# Patient Record
Sex: Female | Born: 1940 | Race: White | Hispanic: No | Smoking: Never smoker
Health system: Southern US, Community
[De-identification: ages and names within clinical notes are randomized; demographics above are authoritative.]

---

## 2008-08-27 ENCOUNTER — Ambulatory Visit: Payer: Self-pay | Admitting: Internal Medicine

## 2008-09-03 ENCOUNTER — Ambulatory Visit: Payer: Self-pay | Admitting: Internal Medicine

## 2009-02-07 HISTORY — PX: BREAST CYST ASPIRATION: SHX578

## 2009-04-17 ENCOUNTER — Ambulatory Visit: Payer: Self-pay | Admitting: Unknown Physician Specialty

## 2011-03-11 ENCOUNTER — Ambulatory Visit: Payer: Self-pay | Admitting: Internal Medicine

## 2012-04-11 ENCOUNTER — Ambulatory Visit: Payer: Self-pay | Admitting: Internal Medicine

## 2012-04-24 ENCOUNTER — Ambulatory Visit: Payer: Self-pay | Admitting: Internal Medicine

## 2012-05-15 ENCOUNTER — Ambulatory Visit: Payer: Self-pay | Admitting: Surgery

## 2012-05-15 ENCOUNTER — Ambulatory Visit: Payer: Self-pay | Admitting: General Surgery

## 2012-08-22 ENCOUNTER — Ambulatory Visit: Payer: Self-pay | Admitting: Ophthalmology

## 2012-09-26 ENCOUNTER — Ambulatory Visit: Payer: Self-pay | Admitting: Ophthalmology

## 2012-10-22 ENCOUNTER — Ambulatory Visit: Payer: Self-pay | Admitting: Podiatry

## 2013-04-12 ENCOUNTER — Ambulatory Visit: Payer: Self-pay | Admitting: Internal Medicine

## 2014-03-07 DIAGNOSIS — I10 Essential (primary) hypertension: Secondary | ICD-10-CM | POA: Diagnosis not present

## 2014-03-07 DIAGNOSIS — E785 Hyperlipidemia, unspecified: Secondary | ICD-10-CM | POA: Diagnosis not present

## 2014-03-14 DIAGNOSIS — E780 Pure hypercholesterolemia: Secondary | ICD-10-CM | POA: Diagnosis not present

## 2014-03-14 DIAGNOSIS — F419 Anxiety disorder, unspecified: Secondary | ICD-10-CM | POA: Insufficient documentation

## 2014-03-14 DIAGNOSIS — Z1239 Encounter for other screening for malignant neoplasm of breast: Secondary | ICD-10-CM | POA: Diagnosis not present

## 2014-03-14 DIAGNOSIS — Z1211 Encounter for screening for malignant neoplasm of colon: Secondary | ICD-10-CM | POA: Diagnosis not present

## 2014-03-14 DIAGNOSIS — I10 Essential (primary) hypertension: Secondary | ICD-10-CM | POA: Diagnosis not present

## 2014-05-09 ENCOUNTER — Ambulatory Visit
Admit: 2014-05-09 | Disposition: A | Discharge status: Home / Self Care | Payer: Self-pay | Attending: Unknown Physician Specialty | Admitting: Unknown Physician Specialty

## 2014-05-09 DIAGNOSIS — I10 Essential (primary) hypertension: Secondary | ICD-10-CM | POA: Diagnosis not present

## 2014-05-09 DIAGNOSIS — Z1211 Encounter for screening for malignant neoplasm of colon: Secondary | ICD-10-CM | POA: Diagnosis not present

## 2014-05-09 DIAGNOSIS — Z800 Family history of malignant neoplasm of digestive organs: Secondary | ICD-10-CM | POA: Diagnosis not present

## 2014-05-09 DIAGNOSIS — K640 First degree hemorrhoids: Secondary | ICD-10-CM | POA: Diagnosis not present

## 2014-05-09 DIAGNOSIS — Z791 Long term (current) use of non-steroidal anti-inflammatories (NSAID): Secondary | ICD-10-CM | POA: Diagnosis not present

## 2014-05-09 DIAGNOSIS — E780 Pure hypercholesterolemia: Secondary | ICD-10-CM | POA: Diagnosis not present

## 2014-05-09 DIAGNOSIS — D128 Benign neoplasm of rectum: Secondary | ICD-10-CM | POA: Diagnosis not present

## 2014-05-09 DIAGNOSIS — K621 Rectal polyp: Secondary | ICD-10-CM | POA: Diagnosis not present

## 2014-05-09 DIAGNOSIS — E785 Hyperlipidemia, unspecified: Secondary | ICD-10-CM | POA: Diagnosis not present

## 2014-05-09 DIAGNOSIS — Z8371 Family history of colonic polyps: Secondary | ICD-10-CM | POA: Diagnosis not present

## 2014-05-09 DIAGNOSIS — F419 Anxiety disorder, unspecified: Secondary | ICD-10-CM | POA: Diagnosis not present

## 2014-06-02 LAB — SURGICAL PATHOLOGY

## 2014-06-04 ENCOUNTER — Ambulatory Visit
Admit: 2014-06-04 | Disposition: A | Discharge status: Home / Self Care | Payer: Self-pay | Attending: Internal Medicine | Admitting: Internal Medicine

## 2014-06-04 DIAGNOSIS — Z1231 Encounter for screening mammogram for malignant neoplasm of breast: Secondary | ICD-10-CM | POA: Diagnosis not present

## 2014-09-11 DIAGNOSIS — I10 Essential (primary) hypertension: Secondary | ICD-10-CM | POA: Diagnosis not present

## 2014-09-11 DIAGNOSIS — Z791 Long term (current) use of non-steroidal anti-inflammatories (NSAID): Secondary | ICD-10-CM | POA: Diagnosis not present

## 2014-09-11 DIAGNOSIS — E780 Pure hypercholesterolemia: Secondary | ICD-10-CM | POA: Diagnosis not present

## 2014-09-18 DIAGNOSIS — E780 Pure hypercholesterolemia: Secondary | ICD-10-CM | POA: Diagnosis not present

## 2014-09-18 DIAGNOSIS — F419 Anxiety disorder, unspecified: Secondary | ICD-10-CM | POA: Diagnosis not present

## 2014-09-18 DIAGNOSIS — I10 Essential (primary) hypertension: Secondary | ICD-10-CM | POA: Diagnosis not present

## 2014-12-10 DIAGNOSIS — M65271 Calcific tendinitis, right ankle and foot: Secondary | ICD-10-CM | POA: Diagnosis not present

## 2014-12-10 DIAGNOSIS — M65272 Calcific tendinitis, left ankle and foot: Secondary | ICD-10-CM | POA: Diagnosis not present

## 2015-03-23 DIAGNOSIS — F419 Anxiety disorder, unspecified: Secondary | ICD-10-CM | POA: Diagnosis not present

## 2015-03-23 DIAGNOSIS — R030 Elevated blood-pressure reading, without diagnosis of hypertension: Secondary | ICD-10-CM | POA: Diagnosis not present

## 2015-03-23 DIAGNOSIS — E7800 Pure hypercholesterolemia, unspecified: Secondary | ICD-10-CM | POA: Diagnosis not present

## 2015-03-30 ENCOUNTER — Other Ambulatory Visit: Payer: Self-pay | Admitting: Internal Medicine

## 2015-03-30 DIAGNOSIS — Z1239 Encounter for other screening for malignant neoplasm of breast: Secondary | ICD-10-CM | POA: Diagnosis not present

## 2015-03-30 DIAGNOSIS — Z1231 Encounter for screening mammogram for malignant neoplasm of breast: Secondary | ICD-10-CM

## 2015-03-30 DIAGNOSIS — F419 Anxiety disorder, unspecified: Secondary | ICD-10-CM | POA: Diagnosis not present

## 2015-03-30 DIAGNOSIS — E784 Other hyperlipidemia: Secondary | ICD-10-CM | POA: Diagnosis not present

## 2015-03-30 DIAGNOSIS — R030 Elevated blood-pressure reading, without diagnosis of hypertension: Secondary | ICD-10-CM | POA: Diagnosis not present

## 2015-06-05 ENCOUNTER — Ambulatory Visit: Payer: Self-pay

## 2015-06-08 ENCOUNTER — Ambulatory Visit
Admission: RE | Admit: 2015-06-08 | Discharge: 2015-06-08 | Disposition: A | Discharge status: Home / Self Care | Payer: Commercial Managed Care - HMO | Source: Ambulatory Visit | Attending: Internal Medicine | Admitting: Internal Medicine

## 2015-06-08 ENCOUNTER — Other Ambulatory Visit: Payer: Self-pay | Admitting: Internal Medicine

## 2015-06-08 DIAGNOSIS — Z1231 Encounter for screening mammogram for malignant neoplasm of breast: Secondary | ICD-10-CM

## 2015-07-13 DIAGNOSIS — H04123 Dry eye syndrome of bilateral lacrimal glands: Secondary | ICD-10-CM | POA: Diagnosis not present

## 2015-09-22 DIAGNOSIS — F419 Anxiety disorder, unspecified: Secondary | ICD-10-CM | POA: Diagnosis not present

## 2015-09-22 DIAGNOSIS — E784 Other hyperlipidemia: Secondary | ICD-10-CM | POA: Diagnosis not present

## 2015-09-22 DIAGNOSIS — R030 Elevated blood-pressure reading, without diagnosis of hypertension: Secondary | ICD-10-CM | POA: Diagnosis not present

## 2015-09-30 ENCOUNTER — Ambulatory Visit (INDEPENDENT_AMBULATORY_CARE_PROVIDER_SITE_OTHER): Payer: Commercial Managed Care - HMO

## 2015-09-30 ENCOUNTER — Ambulatory Visit (INDEPENDENT_AMBULATORY_CARE_PROVIDER_SITE_OTHER): Payer: Commercial Managed Care - HMO | Admitting: Podiatry

## 2015-09-30 ENCOUNTER — Encounter: Payer: Self-pay | Admitting: Podiatry

## 2015-09-30 VITALS — BP 150/88 | HR 69 | Resp 12

## 2015-09-30 DIAGNOSIS — R030 Elevated blood-pressure reading, without diagnosis of hypertension: Secondary | ICD-10-CM | POA: Diagnosis not present

## 2015-09-30 DIAGNOSIS — M722 Plantar fascial fibromatosis: Secondary | ICD-10-CM

## 2015-09-30 DIAGNOSIS — E784 Other hyperlipidemia: Secondary | ICD-10-CM | POA: Diagnosis not present

## 2015-09-30 DIAGNOSIS — F419 Anxiety disorder, unspecified: Secondary | ICD-10-CM | POA: Diagnosis not present

## 2015-09-30 MED ORDER — METHYLPREDNISOLONE 4 MG PO TBPK: ORAL_TABLET | ORAL | 0 refills | Status: DC

## 2015-09-30 MED ORDER — MELOXICAM 15 MG PO TABS: 15.0000 mg | ORAL_TABLET | Freq: Every day | ORAL | 3 refills | Status: DC

## 2015-09-30 NOTE — Patient Instructions (Signed)

## 2015-09-30 NOTE — Progress Notes (Signed)
   Subjective:    Patient ID: Barbara Jensen, female    DOB: 05/27/1940, 75 y.o.   MRN: [id number]  HPI: She presents today with a 2 year history of inferior posterior heel pain. She's been to multiple doctors including podiatrists and orthopedic surgeons have done nothing more than stretching exercises and inserts. She states that her feet hurt worse in the morning and after she's been sitting for a while and gets back up to walk. She denies any trauma or relates shoe gear irritation to the posterior aspect of the right heel.    Review of Systems  Musculoskeletal: Positive for gait problem.       Objective:   Physical Exam: Vital signs are stable she is alert and oriented 3 in no apparent distress. Pulses are strongly palpable. Neurologic sensorium is intact. Deep tendon reflexes are intact. Muscle strength +5 over 5 or 6 flexors plantar flexors and inverters everters all intrinsic musculature is intact. Orthopedic evaluation demonstrates all joints distal to the ankle for range of motion without crepitation. She has mild tenderness on palpation posterior aspect of the bilateral heels right greater than left with a large nonpulsatile nodular mass in the dorsal lateral aspect of the posterior calcaneus right. She also has pain on palpation medial calcaneal tubercles bilateral. Radiographs confirm spurs posterior and inferior with soft tissue increase in densities in both sides. This is consistent with plantar fasciitis and Achilles tendinitis. Cutaneous evaluation demonstrates supple well-hydrated cutis no signs of infection no open lesions or wounds.        Assessment & Plan:  Plantar fasciitis bilateral with posterior compensatory Achilles tendinitis.  Plan: Injected the bilateral heels today with Kenalog and local anesthetic plantarly. Placed her on a Medrol Dosepak to be followed by meloxicam. Discussed appropriate shoe gear stretching exercises and ice therapy. Dispense plantar fascia  braces and night splints. Follow up with her in 1 month. Also dispensed stretching exercises.

## 2015-10-28 ENCOUNTER — Ambulatory Visit (INDEPENDENT_AMBULATORY_CARE_PROVIDER_SITE_OTHER): Payer: Commercial Managed Care - HMO | Admitting: Podiatry

## 2015-10-28 ENCOUNTER — Encounter: Payer: Self-pay | Admitting: Podiatry

## 2015-10-28 DIAGNOSIS — M722 Plantar fascial fibromatosis: Secondary | ICD-10-CM | POA: Diagnosis not present

## 2015-10-28 NOTE — Progress Notes (Signed)
She presents today for follow-up of her plantar fasciitis. He states for about 2 weeks she was 100% improved. Now she has regressed back to approximately 50-70%.  Objective: Vital signs are stable alert and oriented 3. She has pain on palpation medial calcaneal tubercle left greater than right.  Assessment: Pain in limb secondary to plantar fasciitis bilateral.  Plan: We injected bilateral heels today and she will continue her meloxicam. She will continue all other conservative therapies by a brace and night splint. I will follow up with her in 1 month. She is going on a cruise at the end of December.

## 2015-12-07 ENCOUNTER — Ambulatory Visit (INDEPENDENT_AMBULATORY_CARE_PROVIDER_SITE_OTHER): Payer: Commercial Managed Care - HMO | Admitting: Podiatry

## 2015-12-07 DIAGNOSIS — M7662 Achilles tendinitis, left leg: Secondary | ICD-10-CM | POA: Diagnosis not present

## 2015-12-07 DIAGNOSIS — M722 Plantar fascial fibromatosis: Secondary | ICD-10-CM

## 2015-12-07 DIAGNOSIS — M7661 Achilles tendinitis, right leg: Secondary | ICD-10-CM

## 2015-12-07 NOTE — Progress Notes (Signed)
She presents today for follow-up of plantar fasciitis to the left foot and some Achilles tendinitis to the right foot. She states that at this point with the injection and the anti-inflammatory she cannot honestly say she is approximately 90-95% improved. She states that she feels like she continues to improve all time. She will be leaving on a cruise December 31.  Objective: Vital signs are stable alert and oriented 3. Pulses are palpable. Neurologic sensorium is intact. Degenerative flexors are intact. Mild tenderness on palpation may continue tube of the left heel but there is no warmth on palpation. She has no pain on palpation of the Achilles right.  Assessment: Resolving plantar fasciitis left and Achilles tendinitis right.  Plan: Encouraged her to continue all conservative therapies including medications and braces night splints and shoe gear change. Follow up with her in 6 weeks if necessary.

## 2016-01-20 ENCOUNTER — Ambulatory Visit (INDEPENDENT_AMBULATORY_CARE_PROVIDER_SITE_OTHER): Payer: Commercial Managed Care - HMO | Admitting: Podiatry

## 2016-01-20 ENCOUNTER — Encounter: Payer: Self-pay | Admitting: Podiatry

## 2016-01-20 DIAGNOSIS — M7661 Achilles tendinitis, right leg: Secondary | ICD-10-CM

## 2016-01-20 DIAGNOSIS — M7662 Achilles tendinitis, left leg: Secondary | ICD-10-CM | POA: Diagnosis not present

## 2016-01-20 DIAGNOSIS — M722 Plantar fascial fibromatosis: Secondary | ICD-10-CM | POA: Diagnosis not present

## 2016-01-20 MED ORDER — DICLOFENAC SODIUM 75 MG PO TBEC: 75.0000 mg | DELAYED_RELEASE_TABLET | Freq: Two times a day (BID) | ORAL | 1 refills | Status: AC

## 2016-01-20 NOTE — Progress Notes (Signed)
She presents today for follow-up of plantar fasciitis of the left heel and Achilles tendinitis to the right heel. She states that she was doing great and now has regressed.  Objective: Pulses are palpable. She still has pain on palpation posterior inferolateral aspect of the Achilles tendon right posterior heel pain on palpation medial calcaneal tubercle of the left plantar fascia calcaneal insertion site.  Assessment: Intractable Achilles tendinitis right and intractable plantar fasciitis left.  Plan: Discussed etiology pathologies are versus surgical therapies at this point I recommended she see scanned for orthotics and I reinjected the bilateral sites today. I will follow up with her when she uses orthotics and consider physical therapy at that time.

## 2016-02-17 ENCOUNTER — Ambulatory Visit: Payer: Commercial Managed Care - HMO | Admitting: Podiatry

## 2016-02-22 ENCOUNTER — Ambulatory Visit (INDEPENDENT_AMBULATORY_CARE_PROVIDER_SITE_OTHER): Payer: Self-pay | Admitting: Podiatry

## 2016-02-22 ENCOUNTER — Encounter: Payer: Self-pay | Admitting: Podiatry

## 2016-02-22 DIAGNOSIS — M7661 Achilles tendinitis, right leg: Secondary | ICD-10-CM

## 2016-02-22 DIAGNOSIS — M7662 Achilles tendinitis, left leg: Secondary | ICD-10-CM

## 2016-02-22 DIAGNOSIS — M722 Plantar fascial fibromatosis: Secondary | ICD-10-CM

## 2016-02-22 NOTE — Progress Notes (Signed)
Dispensed patient's orthotics with oral and written instructions for wearing. Patient will follow up with Dr. Hyatt in 1 month for an orthotic check. 

## 2016-02-22 NOTE — Patient Instructions (Signed)

## 2016-03-25 DIAGNOSIS — F419 Anxiety disorder, unspecified: Secondary | ICD-10-CM | POA: Diagnosis not present

## 2016-03-25 DIAGNOSIS — E7800 Pure hypercholesterolemia, unspecified: Secondary | ICD-10-CM | POA: Diagnosis not present

## 2016-03-25 DIAGNOSIS — Z0000 Encounter for general adult medical examination without abnormal findings: Secondary | ICD-10-CM | POA: Diagnosis not present

## 2016-03-25 DIAGNOSIS — R030 Elevated blood-pressure reading, without diagnosis of hypertension: Secondary | ICD-10-CM | POA: Diagnosis not present

## 2016-04-01 ENCOUNTER — Other Ambulatory Visit: Payer: Self-pay | Admitting: Internal Medicine

## 2016-04-01 DIAGNOSIS — F320 Major depressive disorder, single episode, mild: Secondary | ICD-10-CM | POA: Diagnosis not present

## 2016-04-01 DIAGNOSIS — F3342 Major depressive disorder, recurrent, in full remission: Secondary | ICD-10-CM | POA: Insufficient documentation

## 2016-04-01 DIAGNOSIS — E784 Other hyperlipidemia: Secondary | ICD-10-CM | POA: Diagnosis not present

## 2016-04-01 DIAGNOSIS — Z0000 Encounter for general adult medical examination without abnormal findings: Secondary | ICD-10-CM | POA: Diagnosis not present

## 2016-04-01 DIAGNOSIS — R030 Elevated blood-pressure reading, without diagnosis of hypertension: Secondary | ICD-10-CM | POA: Diagnosis not present

## 2016-04-01 DIAGNOSIS — Z1231 Encounter for screening mammogram for malignant neoplasm of breast: Secondary | ICD-10-CM | POA: Diagnosis not present

## 2016-04-01 DIAGNOSIS — F419 Anxiety disorder, unspecified: Secondary | ICD-10-CM | POA: Diagnosis not present

## 2016-04-07 DIAGNOSIS — L718 Other rosacea: Secondary | ICD-10-CM | POA: Diagnosis not present

## 2016-04-07 DIAGNOSIS — L578 Other skin changes due to chronic exposure to nonionizing radiation: Secondary | ICD-10-CM | POA: Diagnosis not present

## 2016-04-07 DIAGNOSIS — D1800 Hemangioma unspecified site: Secondary | ICD-10-CM | POA: Diagnosis not present

## 2016-04-07 DIAGNOSIS — L821 Other seborrheic keratosis: Secondary | ICD-10-CM | POA: Diagnosis not present

## 2016-04-07 DIAGNOSIS — D692 Other nonthrombocytopenic purpura: Secondary | ICD-10-CM | POA: Diagnosis not present

## 2016-04-07 DIAGNOSIS — Z1283 Encounter for screening for malignant neoplasm of skin: Secondary | ICD-10-CM | POA: Diagnosis not present

## 2016-04-07 DIAGNOSIS — L570 Actinic keratosis: Secondary | ICD-10-CM | POA: Diagnosis not present

## 2016-04-07 DIAGNOSIS — L820 Inflamed seborrheic keratosis: Secondary | ICD-10-CM | POA: Diagnosis not present

## 2016-04-07 DIAGNOSIS — L720 Epidermal cyst: Secondary | ICD-10-CM | POA: Diagnosis not present

## 2016-05-02 DIAGNOSIS — F320 Major depressive disorder, single episode, mild: Secondary | ICD-10-CM | POA: Diagnosis not present

## 2016-05-02 DIAGNOSIS — R030 Elevated blood-pressure reading, without diagnosis of hypertension: Secondary | ICD-10-CM | POA: Diagnosis not present

## 2016-05-02 DIAGNOSIS — E784 Other hyperlipidemia: Secondary | ICD-10-CM | POA: Diagnosis not present

## 2016-06-09 ENCOUNTER — Ambulatory Visit: Payer: Commercial Managed Care - HMO | Attending: Internal Medicine

## 2016-07-26 DIAGNOSIS — E784 Other hyperlipidemia: Secondary | ICD-10-CM | POA: Diagnosis not present

## 2016-07-26 DIAGNOSIS — R030 Elevated blood-pressure reading, without diagnosis of hypertension: Secondary | ICD-10-CM | POA: Diagnosis not present

## 2016-08-02 DIAGNOSIS — E784 Other hyperlipidemia: Secondary | ICD-10-CM | POA: Diagnosis not present

## 2016-08-02 DIAGNOSIS — R030 Elevated blood-pressure reading, without diagnosis of hypertension: Secondary | ICD-10-CM | POA: Diagnosis not present

## 2016-08-02 DIAGNOSIS — F320 Major depressive disorder, single episode, mild: Secondary | ICD-10-CM | POA: Diagnosis not present

## 2016-08-02 DIAGNOSIS — F419 Anxiety disorder, unspecified: Secondary | ICD-10-CM | POA: Diagnosis not present

## 2016-08-03 DIAGNOSIS — R69 Illness, unspecified: Secondary | ICD-10-CM | POA: Diagnosis not present

## 2016-12-22 DIAGNOSIS — K006 Disturbances in tooth eruption: Secondary | ICD-10-CM | POA: Diagnosis not present

## 2016-12-22 DIAGNOSIS — R69 Illness, unspecified: Secondary | ICD-10-CM | POA: Diagnosis not present

## 2017-02-02 DIAGNOSIS — F320 Major depressive disorder, single episode, mild: Secondary | ICD-10-CM | POA: Diagnosis not present

## 2017-02-02 DIAGNOSIS — R030 Elevated blood-pressure reading, without diagnosis of hypertension: Secondary | ICD-10-CM | POA: Diagnosis not present

## 2017-02-02 DIAGNOSIS — E785 Hyperlipidemia, unspecified: Secondary | ICD-10-CM | POA: Diagnosis not present

## 2017-02-13 DIAGNOSIS — Z1231 Encounter for screening mammogram for malignant neoplasm of breast: Secondary | ICD-10-CM | POA: Diagnosis not present

## 2017-02-13 DIAGNOSIS — F320 Major depressive disorder, single episode, mild: Secondary | ICD-10-CM | POA: Diagnosis not present

## 2017-02-13 DIAGNOSIS — F419 Anxiety disorder, unspecified: Secondary | ICD-10-CM | POA: Diagnosis not present

## 2017-02-13 DIAGNOSIS — E7849 Other hyperlipidemia: Secondary | ICD-10-CM | POA: Diagnosis not present

## 2017-02-13 DIAGNOSIS — R030 Elevated blood-pressure reading, without diagnosis of hypertension: Secondary | ICD-10-CM | POA: Diagnosis not present

## 2017-03-09 ENCOUNTER — Ambulatory Visit
Admission: RE | Admit: 2017-03-09 | Discharge: 2017-03-09 | Disposition: A | Discharge status: Home / Self Care | Payer: Medicare HMO | Source: Ambulatory Visit | Attending: Internal Medicine | Admitting: Internal Medicine

## 2017-03-09 DIAGNOSIS — Z1231 Encounter for screening mammogram for malignant neoplasm of breast: Secondary | ICD-10-CM | POA: Diagnosis not present

## 2017-07-13 DIAGNOSIS — E7849 Other hyperlipidemia: Secondary | ICD-10-CM | POA: Diagnosis not present

## 2017-07-13 DIAGNOSIS — F320 Major depressive disorder, single episode, mild: Secondary | ICD-10-CM | POA: Diagnosis not present

## 2017-07-13 DIAGNOSIS — F419 Anxiety disorder, unspecified: Secondary | ICD-10-CM | POA: Diagnosis not present

## 2017-07-13 DIAGNOSIS — R030 Elevated blood-pressure reading, without diagnosis of hypertension: Secondary | ICD-10-CM | POA: Diagnosis not present

## 2017-07-19 DIAGNOSIS — M549 Dorsalgia, unspecified: Secondary | ICD-10-CM | POA: Diagnosis not present

## 2017-07-19 DIAGNOSIS — Z23 Encounter for immunization: Secondary | ICD-10-CM | POA: Diagnosis not present

## 2017-07-19 DIAGNOSIS — E7849 Other hyperlipidemia: Secondary | ICD-10-CM | POA: Diagnosis not present

## 2017-07-19 DIAGNOSIS — R030 Elevated blood-pressure reading, without diagnosis of hypertension: Secondary | ICD-10-CM | POA: Diagnosis not present

## 2017-07-19 DIAGNOSIS — Z0000 Encounter for general adult medical examination without abnormal findings: Secondary | ICD-10-CM | POA: Diagnosis not present

## 2017-07-19 DIAGNOSIS — F320 Major depressive disorder, single episode, mild: Secondary | ICD-10-CM | POA: Diagnosis not present

## 2017-07-19 DIAGNOSIS — Z79899 Other long term (current) drug therapy: Secondary | ICD-10-CM | POA: Diagnosis not present

## 2017-07-19 DIAGNOSIS — S81812A Laceration without foreign body, left lower leg, initial encounter: Secondary | ICD-10-CM | POA: Diagnosis not present

## 2017-07-19 DIAGNOSIS — F419 Anxiety disorder, unspecified: Secondary | ICD-10-CM | POA: Diagnosis not present

## 2017-11-29 DIAGNOSIS — D692 Other nonthrombocytopenic purpura: Secondary | ICD-10-CM | POA: Diagnosis not present

## 2017-11-29 DIAGNOSIS — L820 Inflamed seborrheic keratosis: Secondary | ICD-10-CM | POA: Diagnosis not present

## 2017-11-29 DIAGNOSIS — L821 Other seborrheic keratosis: Secondary | ICD-10-CM | POA: Diagnosis not present

## 2017-11-29 DIAGNOSIS — L578 Other skin changes due to chronic exposure to nonionizing radiation: Secondary | ICD-10-CM | POA: Diagnosis not present

## 2017-11-29 DIAGNOSIS — L570 Actinic keratosis: Secondary | ICD-10-CM | POA: Diagnosis not present

## 2017-11-29 DIAGNOSIS — D229 Melanocytic nevi, unspecified: Secondary | ICD-10-CM | POA: Diagnosis not present

## 2017-11-29 DIAGNOSIS — Z1283 Encounter for screening for malignant neoplasm of skin: Secondary | ICD-10-CM | POA: Diagnosis not present

## 2017-11-29 DIAGNOSIS — L812 Freckles: Secondary | ICD-10-CM | POA: Diagnosis not present

## 2018-01-17 DIAGNOSIS — R030 Elevated blood-pressure reading, without diagnosis of hypertension: Secondary | ICD-10-CM | POA: Diagnosis not present

## 2018-01-17 DIAGNOSIS — E7849 Other hyperlipidemia: Secondary | ICD-10-CM | POA: Diagnosis not present

## 2018-01-17 DIAGNOSIS — Z79899 Other long term (current) drug therapy: Secondary | ICD-10-CM | POA: Diagnosis not present

## 2018-01-24 DIAGNOSIS — E7849 Other hyperlipidemia: Secondary | ICD-10-CM | POA: Diagnosis not present

## 2018-01-24 DIAGNOSIS — F320 Major depressive disorder, single episode, mild: Secondary | ICD-10-CM | POA: Diagnosis not present

## 2018-01-24 DIAGNOSIS — R030 Elevated blood-pressure reading, without diagnosis of hypertension: Secondary | ICD-10-CM | POA: Diagnosis not present

## 2018-02-15 ENCOUNTER — Other Ambulatory Visit: Payer: Self-pay | Admitting: Internal Medicine

## 2018-02-15 DIAGNOSIS — Z1231 Encounter for screening mammogram for malignant neoplasm of breast: Secondary | ICD-10-CM

## 2018-07-19 DIAGNOSIS — R030 Elevated blood-pressure reading, without diagnosis of hypertension: Secondary | ICD-10-CM | POA: Diagnosis not present

## 2018-07-19 DIAGNOSIS — E7849 Other hyperlipidemia: Secondary | ICD-10-CM | POA: Diagnosis not present

## 2018-07-19 DIAGNOSIS — F320 Major depressive disorder, single episode, mild: Secondary | ICD-10-CM | POA: Diagnosis not present

## 2018-07-26 DIAGNOSIS — N183 Chronic kidney disease, stage 3 (moderate): Secondary | ICD-10-CM | POA: Diagnosis not present

## 2018-07-26 DIAGNOSIS — E7849 Other hyperlipidemia: Secondary | ICD-10-CM | POA: Diagnosis not present

## 2018-07-26 DIAGNOSIS — Z780 Asymptomatic menopausal state: Secondary | ICD-10-CM | POA: Diagnosis not present

## 2018-07-26 DIAGNOSIS — F419 Anxiety disorder, unspecified: Secondary | ICD-10-CM | POA: Diagnosis not present

## 2018-07-26 DIAGNOSIS — R030 Elevated blood-pressure reading, without diagnosis of hypertension: Secondary | ICD-10-CM | POA: Diagnosis not present

## 2018-07-26 DIAGNOSIS — Z0000 Encounter for general adult medical examination without abnormal findings: Secondary | ICD-10-CM | POA: Diagnosis not present

## 2018-07-26 DIAGNOSIS — F320 Major depressive disorder, single episode, mild: Secondary | ICD-10-CM | POA: Diagnosis not present

## 2018-08-31 ENCOUNTER — Ambulatory Visit
Admission: RE | Admit: 2018-08-31 | Discharge: 2018-08-31 | Disposition: A | Discharge status: Home / Self Care | Payer: Medicare HMO | Source: Ambulatory Visit | Attending: Internal Medicine | Admitting: Internal Medicine

## 2018-08-31 DIAGNOSIS — Z1231 Encounter for screening mammogram for malignant neoplasm of breast: Secondary | ICD-10-CM | POA: Insufficient documentation

## 2018-09-05 DIAGNOSIS — Z780 Asymptomatic menopausal state: Secondary | ICD-10-CM | POA: Diagnosis not present

## 2018-10-30 DIAGNOSIS — H43813 Vitreous degeneration, bilateral: Secondary | ICD-10-CM | POA: Diagnosis not present

## 2018-12-10 DIAGNOSIS — R002 Palpitations: Secondary | ICD-10-CM | POA: Diagnosis not present

## 2018-12-10 DIAGNOSIS — M25512 Pain in left shoulder: Secondary | ICD-10-CM | POA: Diagnosis not present

## 2018-12-10 DIAGNOSIS — R030 Elevated blood-pressure reading, without diagnosis of hypertension: Secondary | ICD-10-CM | POA: Diagnosis not present

## 2018-12-10 DIAGNOSIS — F419 Anxiety disorder, unspecified: Secondary | ICD-10-CM | POA: Diagnosis not present

## 2018-12-28 IMAGING — MG MM DIGITAL SCREENING BILAT W/ TOMO W/ CAD
9 of 12 series · 9 of 28 positions shown · non-contrast
Comparison: Previous exam(s).

CLINICAL DATA: Screening.

EXAM:
2D DIGITAL SCREENING BILATERAL MAMMOGRAM WITH 3D TOMO WITH CAD

[L MLO synth-2D]
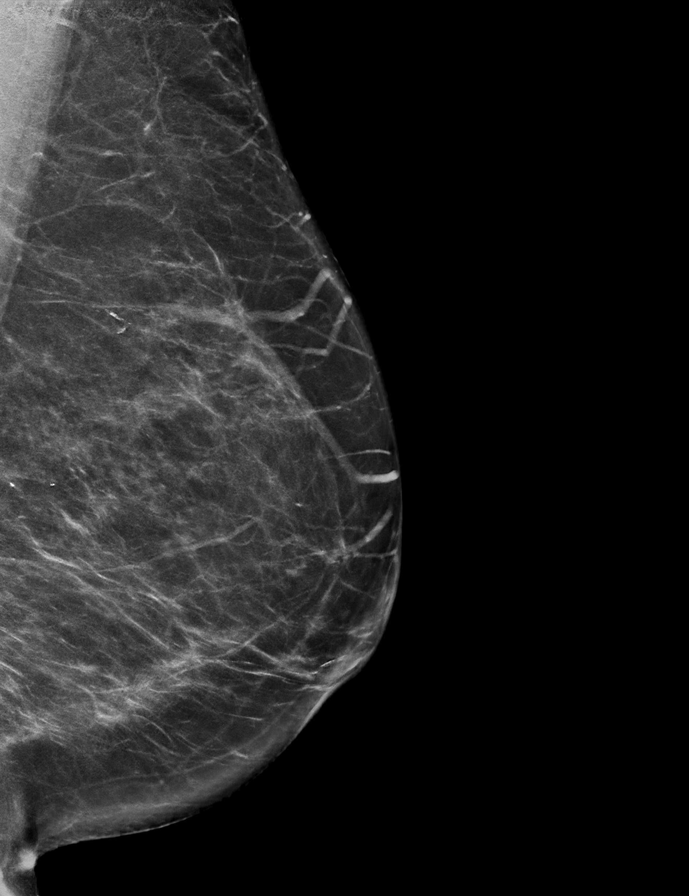

[R CC]
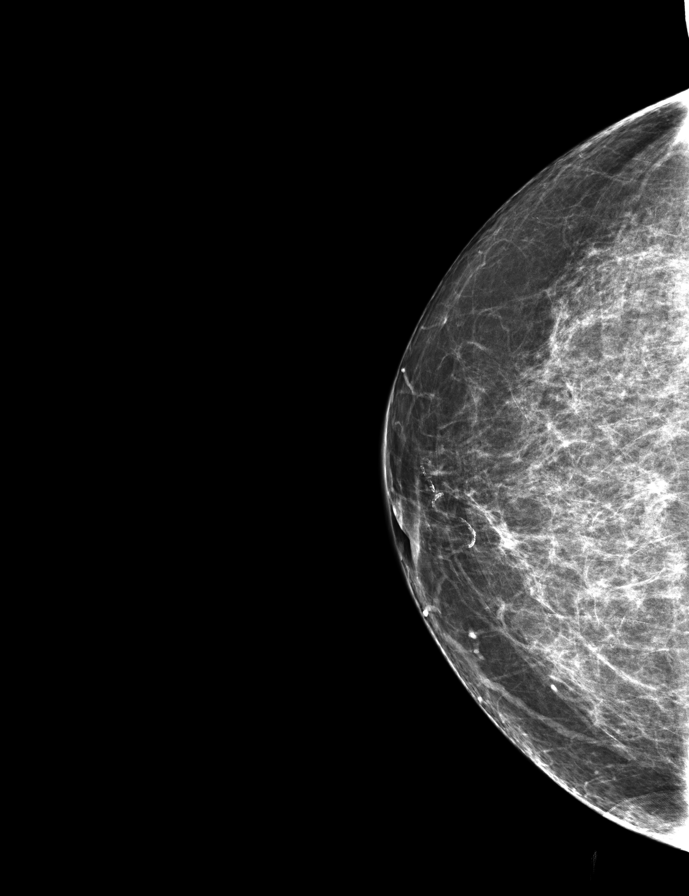

[L MLO]
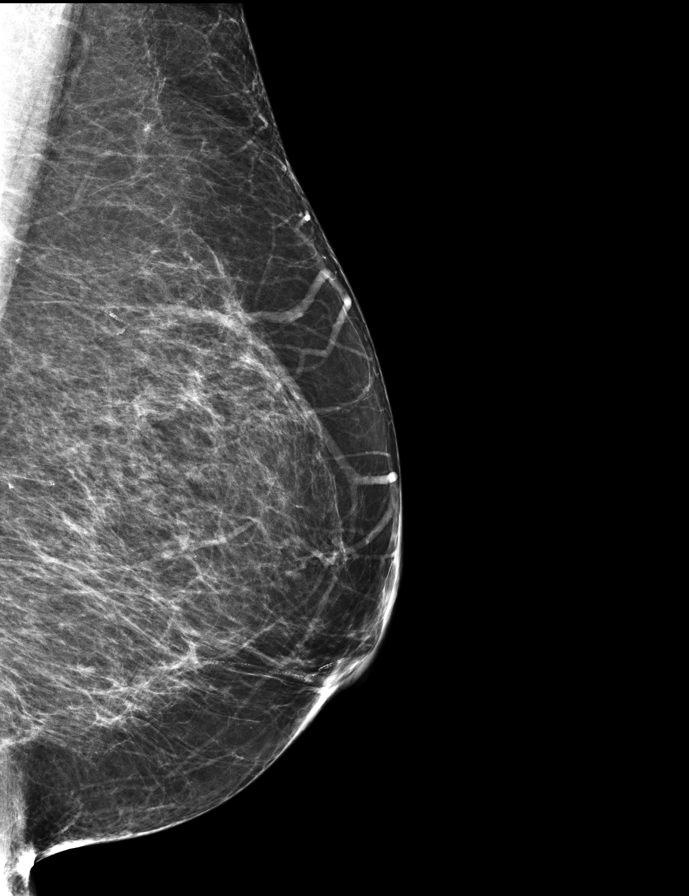

[R MLO]
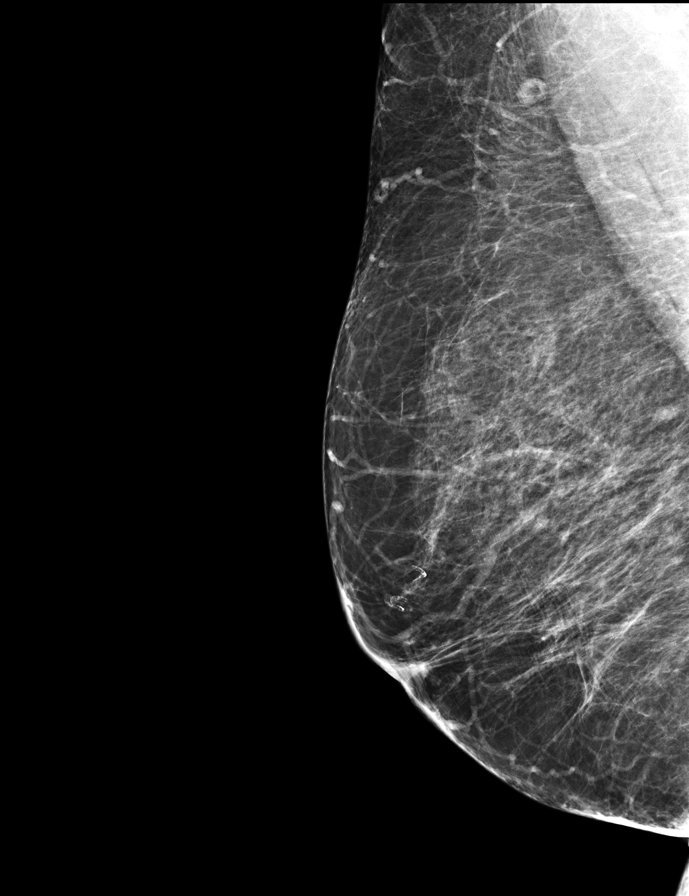

[L CC]
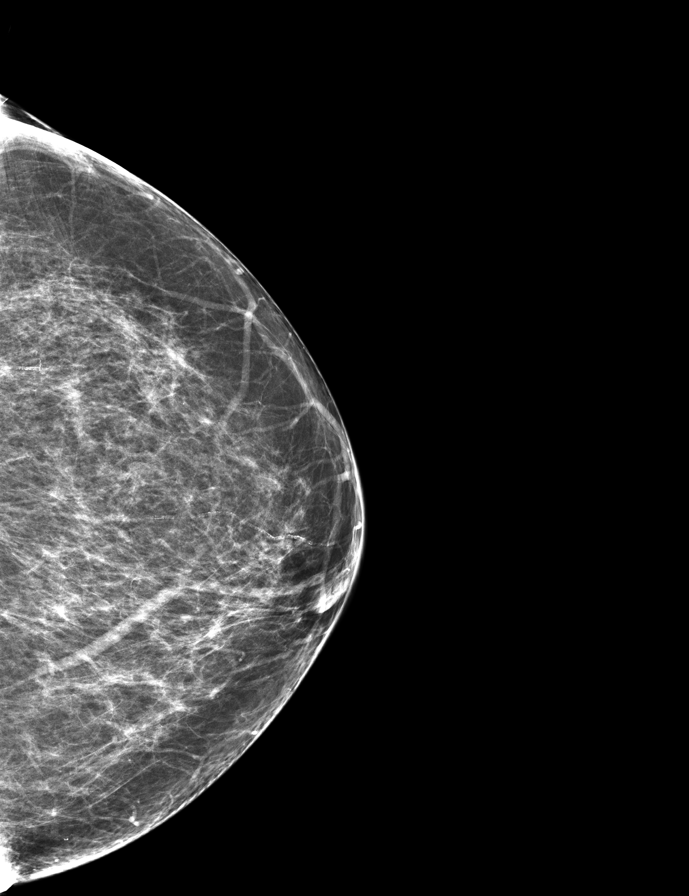

[R MLO synth-2D]
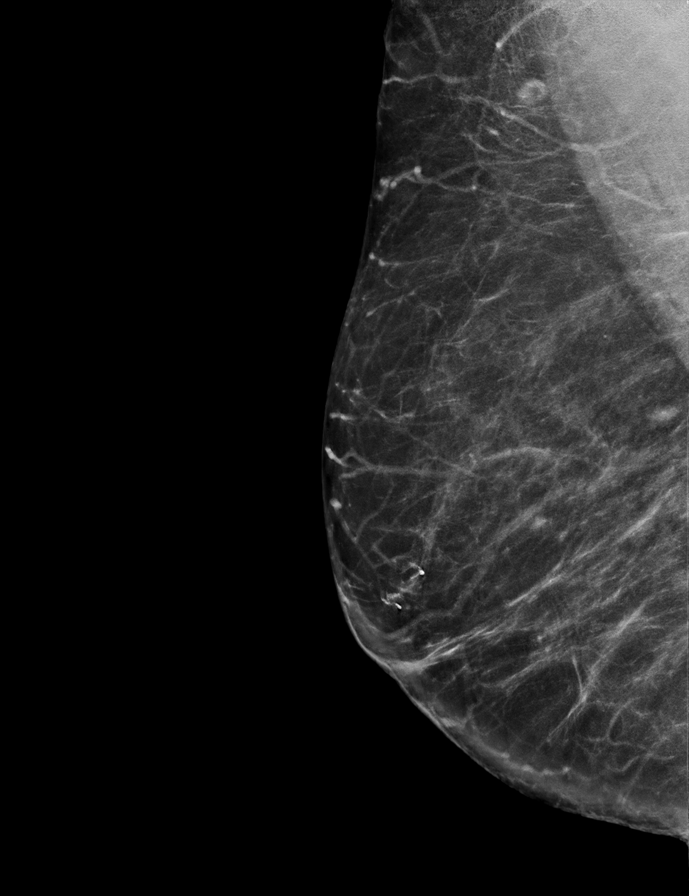

[L CC synth-2D]
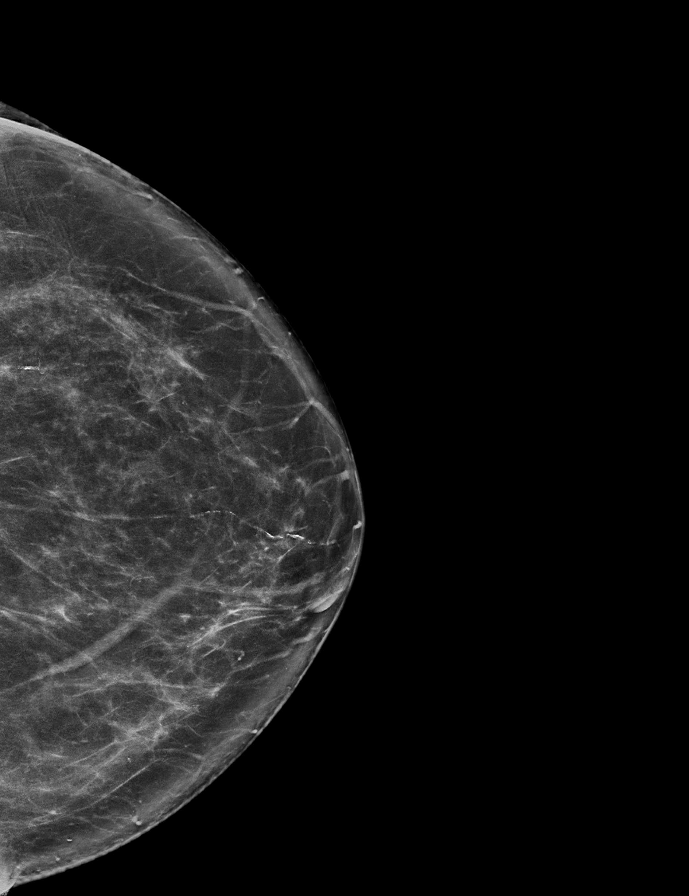

[R CC synth-2D]
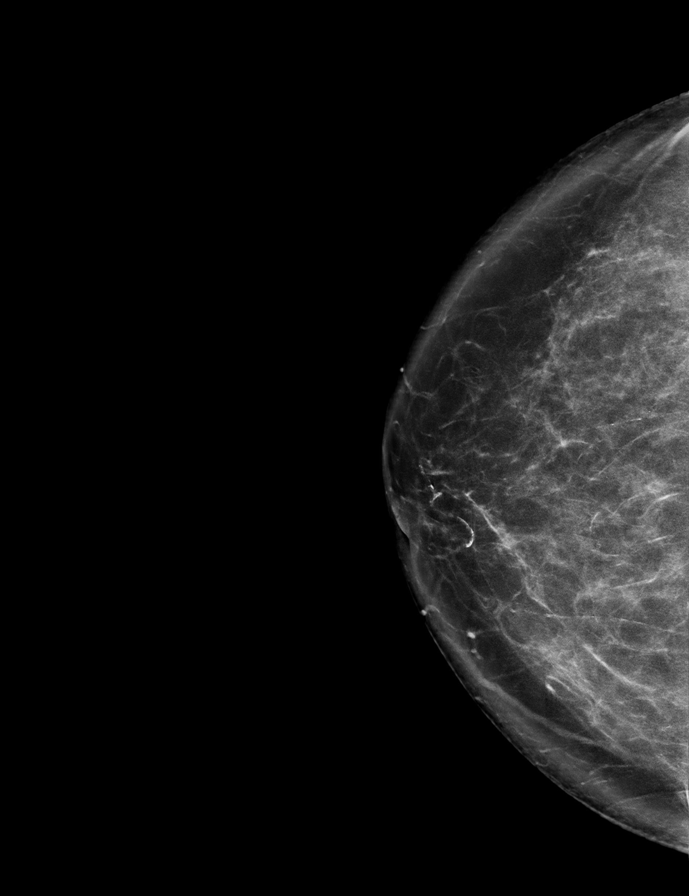

[R MLO tomo · tomo slice 39/78.0]
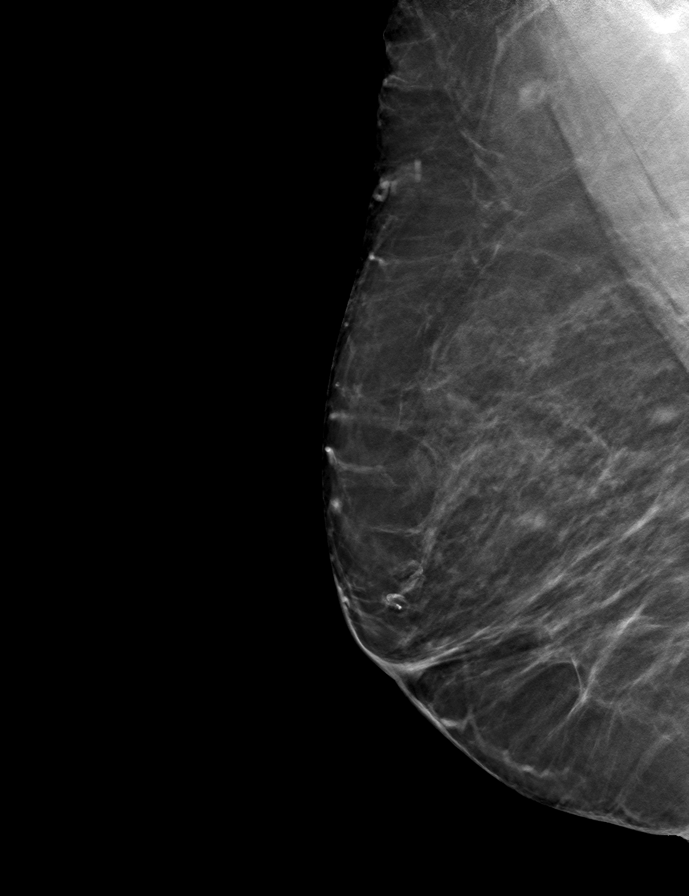

[9 of 28 positions shown; findings below may reference images not displayed]

ACR Breast Density Category c: The breast tissue is heterogeneously
dense, which may obscure small masses.
FINDINGS: There are no findings suspicious for malignancy. Images were
processed with CAD.
IMPRESSION: No mammographic evidence of malignancy. A result letter of this
screening mammogram will be mailed directly to the patient.

RECOMMENDATION:
Screening mammogram in one year. (Code:UA-[id number])

BI-RADS CATEGORY  1: Negative.

## 2019-01-06 DIAGNOSIS — S61211A Laceration without foreign body of left index finger without damage to nail, initial encounter: Secondary | ICD-10-CM | POA: Diagnosis not present

## 2019-01-18 DIAGNOSIS — R030 Elevated blood-pressure reading, without diagnosis of hypertension: Secondary | ICD-10-CM | POA: Diagnosis not present

## 2019-01-18 DIAGNOSIS — E7849 Other hyperlipidemia: Secondary | ICD-10-CM | POA: Diagnosis not present

## 2019-01-18 DIAGNOSIS — N183 Chronic kidney disease, stage 3 unspecified: Secondary | ICD-10-CM | POA: Diagnosis not present

## 2019-01-23 DIAGNOSIS — J069 Acute upper respiratory infection, unspecified: Secondary | ICD-10-CM | POA: Diagnosis not present

## 2019-01-28 DIAGNOSIS — Z8619 Personal history of other infectious and parasitic diseases: Secondary | ICD-10-CM | POA: Diagnosis not present

## 2019-02-26 DIAGNOSIS — L570 Actinic keratosis: Secondary | ICD-10-CM | POA: Diagnosis not present

## 2019-02-26 DIAGNOSIS — L858 Other specified epidermal thickening: Secondary | ICD-10-CM | POA: Diagnosis not present

## 2019-02-26 DIAGNOSIS — D485 Neoplasm of uncertain behavior of skin: Secondary | ICD-10-CM | POA: Diagnosis not present

## 2019-05-23 DIAGNOSIS — E7849 Other hyperlipidemia: Secondary | ICD-10-CM | POA: Diagnosis not present

## 2019-05-23 DIAGNOSIS — R030 Elevated blood-pressure reading, without diagnosis of hypertension: Secondary | ICD-10-CM | POA: Diagnosis not present

## 2019-05-23 DIAGNOSIS — Z0000 Encounter for general adult medical examination without abnormal findings: Secondary | ICD-10-CM | POA: Diagnosis not present

## 2019-05-27 DIAGNOSIS — F320 Major depressive disorder, single episode, mild: Secondary | ICD-10-CM | POA: Diagnosis not present

## 2019-05-27 DIAGNOSIS — E7849 Other hyperlipidemia: Secondary | ICD-10-CM | POA: Diagnosis not present

## 2019-05-27 DIAGNOSIS — M1990 Unspecified osteoarthritis, unspecified site: Secondary | ICD-10-CM | POA: Diagnosis not present

## 2019-05-27 DIAGNOSIS — R030 Elevated blood-pressure reading, without diagnosis of hypertension: Secondary | ICD-10-CM | POA: Diagnosis not present

## 2019-05-27 DIAGNOSIS — Z0000 Encounter for general adult medical examination without abnormal findings: Secondary | ICD-10-CM | POA: Diagnosis not present

## 2019-05-27 DIAGNOSIS — F419 Anxiety disorder, unspecified: Secondary | ICD-10-CM | POA: Diagnosis not present

## 2019-05-27 DIAGNOSIS — H6192 Disorder of left external ear, unspecified: Secondary | ICD-10-CM | POA: Diagnosis not present

## 2019-05-27 DIAGNOSIS — M549 Dorsalgia, unspecified: Secondary | ICD-10-CM | POA: Diagnosis not present

## 2019-07-12 DIAGNOSIS — Z800 Family history of malignant neoplasm of digestive organs: Secondary | ICD-10-CM | POA: Diagnosis not present

## 2019-07-12 DIAGNOSIS — Z01812 Encounter for preprocedural laboratory examination: Secondary | ICD-10-CM | POA: Diagnosis not present

## 2019-07-12 DIAGNOSIS — R112 Nausea with vomiting, unspecified: Secondary | ICD-10-CM | POA: Diagnosis not present

## 2019-07-12 DIAGNOSIS — Z9889 Other specified postprocedural states: Secondary | ICD-10-CM | POA: Diagnosis not present

## 2019-09-09 DIAGNOSIS — Z01812 Encounter for preprocedural laboratory examination: Secondary | ICD-10-CM | POA: Diagnosis not present

## 2019-09-10 DIAGNOSIS — Z1211 Encounter for screening for malignant neoplasm of colon: Secondary | ICD-10-CM | POA: Diagnosis not present

## 2019-09-10 DIAGNOSIS — K5730 Diverticulosis of large intestine without perforation or abscess without bleeding: Secondary | ICD-10-CM | POA: Diagnosis not present

## 2019-09-10 DIAGNOSIS — Z800 Family history of malignant neoplasm of digestive organs: Secondary | ICD-10-CM | POA: Diagnosis not present

## 2019-09-10 DIAGNOSIS — K640 First degree hemorrhoids: Secondary | ICD-10-CM | POA: Diagnosis not present

## 2019-11-19 DIAGNOSIS — D492 Neoplasm of unspecified behavior of bone, soft tissue, and skin: Secondary | ICD-10-CM | POA: Diagnosis not present

## 2019-11-19 DIAGNOSIS — L720 Epidermal cyst: Secondary | ICD-10-CM | POA: Diagnosis not present

## 2019-11-19 DIAGNOSIS — L570 Actinic keratosis: Secondary | ICD-10-CM | POA: Diagnosis not present

## 2020-01-14 DIAGNOSIS — E7849 Other hyperlipidemia: Secondary | ICD-10-CM | POA: Diagnosis not present

## 2020-01-14 DIAGNOSIS — R030 Elevated blood-pressure reading, without diagnosis of hypertension: Secondary | ICD-10-CM | POA: Diagnosis not present

## 2020-01-21 DIAGNOSIS — E7849 Other hyperlipidemia: Secondary | ICD-10-CM | POA: Diagnosis not present

## 2020-01-21 DIAGNOSIS — F320 Major depressive disorder, single episode, mild: Secondary | ICD-10-CM | POA: Diagnosis not present

## 2020-01-21 DIAGNOSIS — I10 Essential (primary) hypertension: Secondary | ICD-10-CM | POA: Diagnosis not present

## 2020-04-23 DIAGNOSIS — H04221 Epiphora due to insufficient drainage, right lacrimal gland: Secondary | ICD-10-CM | POA: Diagnosis not present

## 2020-07-17 DIAGNOSIS — I10 Essential (primary) hypertension: Secondary | ICD-10-CM | POA: Diagnosis not present

## 2020-07-17 DIAGNOSIS — E7849 Other hyperlipidemia: Secondary | ICD-10-CM | POA: Diagnosis not present

## 2020-07-17 DIAGNOSIS — F320 Major depressive disorder, single episode, mild: Secondary | ICD-10-CM | POA: Diagnosis not present

## 2020-07-24 DIAGNOSIS — Z0000 Encounter for general adult medical examination without abnormal findings: Secondary | ICD-10-CM | POA: Diagnosis not present

## 2020-07-24 DIAGNOSIS — I10 Essential (primary) hypertension: Secondary | ICD-10-CM | POA: Diagnosis not present

## 2020-07-24 DIAGNOSIS — F320 Major depressive disorder, single episode, mild: Secondary | ICD-10-CM | POA: Diagnosis not present

## 2020-07-24 DIAGNOSIS — E785 Hyperlipidemia, unspecified: Secondary | ICD-10-CM | POA: Diagnosis not present

## 2020-07-24 DIAGNOSIS — F419 Anxiety disorder, unspecified: Secondary | ICD-10-CM | POA: Diagnosis not present

## 2020-10-30 DIAGNOSIS — I10 Essential (primary) hypertension: Secondary | ICD-10-CM | POA: Diagnosis not present

## 2020-10-30 DIAGNOSIS — E7849 Other hyperlipidemia: Secondary | ICD-10-CM | POA: Diagnosis not present

## 2020-11-09 DIAGNOSIS — I10 Essential (primary) hypertension: Secondary | ICD-10-CM | POA: Diagnosis not present

## 2020-11-09 DIAGNOSIS — F320 Major depressive disorder, single episode, mild: Secondary | ICD-10-CM | POA: Diagnosis not present

## 2020-11-09 DIAGNOSIS — F419 Anxiety disorder, unspecified: Secondary | ICD-10-CM | POA: Diagnosis not present

## 2020-11-09 DIAGNOSIS — E785 Hyperlipidemia, unspecified: Secondary | ICD-10-CM | POA: Diagnosis not present

## 2020-11-18 DIAGNOSIS — W19XXXA Unspecified fall, initial encounter: Secondary | ICD-10-CM | POA: Diagnosis not present

## 2020-11-18 DIAGNOSIS — S92061A Displaced intraarticular fracture of right calcaneus, initial encounter for closed fracture: Secondary | ICD-10-CM | POA: Diagnosis not present

## 2020-11-18 DIAGNOSIS — F419 Anxiety disorder, unspecified: Secondary | ICD-10-CM | POA: Diagnosis not present

## 2020-11-18 DIAGNOSIS — S82841D Displaced bimalleolar fracture of right lower leg, subsequent encounter for closed fracture with routine healing: Secondary | ICD-10-CM | POA: Diagnosis not present

## 2020-11-18 DIAGNOSIS — S92191A Other fracture of right talus, initial encounter for closed fracture: Secondary | ICD-10-CM | POA: Diagnosis not present

## 2020-11-18 DIAGNOSIS — W108XXA Fall (on) (from) other stairs and steps, initial encounter: Secondary | ICD-10-CM | POA: Diagnosis not present

## 2020-11-18 DIAGNOSIS — J9811 Atelectasis: Secondary | ICD-10-CM | POA: Diagnosis not present

## 2020-11-18 DIAGNOSIS — M7989 Other specified soft tissue disorders: Secondary | ICD-10-CM | POA: Diagnosis not present

## 2020-11-18 DIAGNOSIS — S92001A Unspecified fracture of right calcaneus, initial encounter for closed fracture: Secondary | ICD-10-CM | POA: Diagnosis not present

## 2020-11-18 DIAGNOSIS — E785 Hyperlipidemia, unspecified: Secondary | ICD-10-CM | POA: Diagnosis not present

## 2020-11-18 DIAGNOSIS — W109XXA Fall (on) (from) unspecified stairs and steps, initial encounter: Secondary | ICD-10-CM | POA: Diagnosis not present

## 2020-11-18 DIAGNOSIS — S92111A Displaced fracture of neck of right talus, initial encounter for closed fracture: Secondary | ICD-10-CM | POA: Diagnosis not present

## 2020-11-18 DIAGNOSIS — S0101XA Laceration without foreign body of scalp, initial encounter: Secondary | ICD-10-CM | POA: Diagnosis not present

## 2020-11-18 DIAGNOSIS — R58 Hemorrhage, not elsewhere classified: Secondary | ICD-10-CM | POA: Diagnosis not present

## 2020-11-18 DIAGNOSIS — R0902 Hypoxemia: Secondary | ICD-10-CM | POA: Diagnosis not present

## 2020-11-18 DIAGNOSIS — S3993XA Unspecified injury of pelvis, initial encounter: Secondary | ICD-10-CM | POA: Diagnosis not present

## 2020-11-18 DIAGNOSIS — S92101A Unspecified fracture of right talus, initial encounter for closed fracture: Secondary | ICD-10-CM | POA: Diagnosis not present

## 2020-11-18 DIAGNOSIS — S0990XA Unspecified injury of head, initial encounter: Secondary | ICD-10-CM | POA: Diagnosis not present

## 2020-11-18 DIAGNOSIS — R0689 Other abnormalities of breathing: Secondary | ICD-10-CM | POA: Diagnosis not present

## 2020-11-18 DIAGNOSIS — S199XXA Unspecified injury of neck, initial encounter: Secondary | ICD-10-CM | POA: Diagnosis not present

## 2020-11-18 DIAGNOSIS — S92111B Displaced fracture of neck of right talus, initial encounter for open fracture: Secondary | ICD-10-CM | POA: Diagnosis not present

## 2020-11-18 DIAGNOSIS — G8918 Other acute postprocedural pain: Secondary | ICD-10-CM | POA: Diagnosis not present

## 2020-11-18 DIAGNOSIS — I10 Essential (primary) hypertension: Secondary | ICD-10-CM | POA: Diagnosis not present

## 2020-11-18 DIAGNOSIS — S92001D Unspecified fracture of right calcaneus, subsequent encounter for fracture with routine healing: Secondary | ICD-10-CM | POA: Diagnosis not present

## 2020-11-18 DIAGNOSIS — R9431 Abnormal electrocardiogram [ECG] [EKG]: Secondary | ICD-10-CM | POA: Diagnosis not present

## 2020-11-18 DIAGNOSIS — Z6833 Body mass index (BMI) 33.0-33.9, adult: Secondary | ICD-10-CM | POA: Diagnosis not present

## 2020-11-18 DIAGNOSIS — E668 Other obesity: Secondary | ICD-10-CM | POA: Diagnosis not present

## 2020-11-18 DIAGNOSIS — R000 Tachycardia, unspecified: Secondary | ICD-10-CM | POA: Diagnosis not present

## 2020-11-18 DIAGNOSIS — S299XXA Unspecified injury of thorax, initial encounter: Secondary | ICD-10-CM | POA: Diagnosis not present

## 2020-12-12 DIAGNOSIS — S92111A Displaced fracture of neck of right talus, initial encounter for closed fracture: Secondary | ICD-10-CM | POA: Diagnosis not present

## 2020-12-23 DIAGNOSIS — S92111D Displaced fracture of neck of right talus, subsequent encounter for fracture with routine healing: Secondary | ICD-10-CM | POA: Diagnosis not present

## 2021-01-27 DIAGNOSIS — M85871 Other specified disorders of bone density and structure, right ankle and foot: Secondary | ICD-10-CM | POA: Diagnosis not present

## 2021-01-27 DIAGNOSIS — S92111D Displaced fracture of neck of right talus, subsequent encounter for fracture with routine healing: Secondary | ICD-10-CM | POA: Diagnosis not present

## 2021-01-27 DIAGNOSIS — M7661 Achilles tendinitis, right leg: Secondary | ICD-10-CM | POA: Diagnosis not present

## 2021-01-27 DIAGNOSIS — L97929 Non-pressure chronic ulcer of unspecified part of left lower leg with unspecified severity: Secondary | ICD-10-CM | POA: Diagnosis not present

## 2021-01-27 DIAGNOSIS — M7751 Other enthesopathy of right foot: Secondary | ICD-10-CM | POA: Diagnosis not present

## 2021-01-27 DIAGNOSIS — W108XXD Fall (on) (from) other stairs and steps, subsequent encounter: Secondary | ICD-10-CM | POA: Diagnosis not present

## 2021-02-25 DIAGNOSIS — I10 Essential (primary) hypertension: Secondary | ICD-10-CM | POA: Diagnosis not present

## 2021-02-25 DIAGNOSIS — I872 Venous insufficiency (chronic) (peripheral): Secondary | ICD-10-CM | POA: Diagnosis not present

## 2021-02-25 DIAGNOSIS — R600 Localized edema: Secondary | ICD-10-CM | POA: Diagnosis not present

## 2021-02-25 DIAGNOSIS — L97922 Non-pressure chronic ulcer of unspecified part of left lower leg with fat layer exposed: Secondary | ICD-10-CM | POA: Diagnosis not present

## 2021-02-25 DIAGNOSIS — S81802A Unspecified open wound, left lower leg, initial encounter: Secondary | ICD-10-CM | POA: Diagnosis not present

## 2021-02-28 DIAGNOSIS — L03116 Cellulitis of left lower limb: Secondary | ICD-10-CM | POA: Diagnosis not present

## 2021-03-31 DIAGNOSIS — L97222 Non-pressure chronic ulcer of left calf with fat layer exposed: Secondary | ICD-10-CM | POA: Diagnosis not present

## 2021-03-31 DIAGNOSIS — Z981 Arthrodesis status: Secondary | ICD-10-CM | POA: Diagnosis not present

## 2021-03-31 DIAGNOSIS — S92111D Displaced fracture of neck of right talus, subsequent encounter for fracture with routine healing: Secondary | ICD-10-CM | POA: Diagnosis not present

## 2021-03-31 DIAGNOSIS — S92111B Displaced fracture of neck of right talus, initial encounter for open fracture: Secondary | ICD-10-CM | POA: Diagnosis not present

## 2021-03-31 DIAGNOSIS — W108XXA Fall (on) (from) other stairs and steps, initial encounter: Secondary | ICD-10-CM | POA: Diagnosis not present

## 2021-05-12 DIAGNOSIS — N1831 Chronic kidney disease, stage 3a: Secondary | ICD-10-CM | POA: Diagnosis not present

## 2021-05-12 DIAGNOSIS — E7849 Other hyperlipidemia: Secondary | ICD-10-CM | POA: Diagnosis not present

## 2021-05-19 DIAGNOSIS — I10 Essential (primary) hypertension: Secondary | ICD-10-CM | POA: Diagnosis not present

## 2021-05-19 DIAGNOSIS — E785 Hyperlipidemia, unspecified: Secondary | ICD-10-CM | POA: Diagnosis not present

## 2021-06-22 ENCOUNTER — Other Ambulatory Visit: Payer: Self-pay | Admitting: Ophthalmology

## 2021-06-22 DIAGNOSIS — Z961 Presence of intraocular lens: Secondary | ICD-10-CM | POA: Diagnosis not present

## 2021-06-22 DIAGNOSIS — Z0100 Encounter for examination of eyes and vision without abnormal findings: Secondary | ICD-10-CM | POA: Diagnosis not present

## 2021-06-22 DIAGNOSIS — H53462 Homonymous bilateral field defects, left side: Secondary | ICD-10-CM

## 2021-07-08 ENCOUNTER — Ambulatory Visit
Admission: RE | Admit: 2021-07-08 | Discharge: 2021-07-08 | Disposition: A | Discharge status: Home / Self Care | Payer: Medicare HMO | Source: Ambulatory Visit | Attending: Ophthalmology | Admitting: Ophthalmology

## 2021-07-08 DIAGNOSIS — H53462 Homonymous bilateral field defects, left side: Secondary | ICD-10-CM | POA: Insufficient documentation

## 2021-07-08 DIAGNOSIS — I6782 Cerebral ischemia: Secondary | ICD-10-CM | POA: Diagnosis not present

## 2021-07-08 DIAGNOSIS — I639 Cerebral infarction, unspecified: Secondary | ICD-10-CM | POA: Diagnosis not present

## 2021-07-08 MED ORDER — GADOBUTROL 1 MMOL/ML IV SOLN
8.0000 mL | Freq: Once | INTRAVENOUS | Status: AC | PRN
  Administered 2021-07-08: 8 mL via INTRAVENOUS

## 2021-07-21 DIAGNOSIS — S92111D Displaced fracture of neck of right talus, subsequent encounter for fracture with routine healing: Secondary | ICD-10-CM | POA: Diagnosis not present

## 2021-07-21 DIAGNOSIS — S92011D Displaced fracture of body of right calcaneus, subsequent encounter for fracture with routine healing: Secondary | ICD-10-CM | POA: Diagnosis not present

## 2021-07-21 DIAGNOSIS — W108XXD Fall (on) (from) other stairs and steps, subsequent encounter: Secondary | ICD-10-CM | POA: Diagnosis not present

## 2021-07-21 DIAGNOSIS — S92111B Displaced fracture of neck of right talus, initial encounter for open fracture: Secondary | ICD-10-CM | POA: Diagnosis not present

## 2021-07-21 DIAGNOSIS — M7731 Calcaneal spur, right foot: Secondary | ICD-10-CM | POA: Diagnosis not present

## 2021-07-21 DIAGNOSIS — M19071 Primary osteoarthritis, right ankle and foot: Secondary | ICD-10-CM | POA: Diagnosis not present

## 2021-08-11 DIAGNOSIS — Z7982 Long term (current) use of aspirin: Secondary | ICD-10-CM | POA: Diagnosis not present

## 2021-08-11 DIAGNOSIS — E7849 Other hyperlipidemia: Secondary | ICD-10-CM | POA: Diagnosis not present

## 2021-08-11 DIAGNOSIS — I10 Essential (primary) hypertension: Secondary | ICD-10-CM | POA: Diagnosis not present

## 2021-08-18 DIAGNOSIS — N183 Chronic kidney disease, stage 3 unspecified: Secondary | ICD-10-CM | POA: Diagnosis not present

## 2021-08-18 DIAGNOSIS — F419 Anxiety disorder, unspecified: Secondary | ICD-10-CM | POA: Diagnosis not present

## 2021-08-18 DIAGNOSIS — N1831 Chronic kidney disease, stage 3a: Secondary | ICD-10-CM | POA: Insufficient documentation

## 2021-08-18 DIAGNOSIS — E785 Hyperlipidemia, unspecified: Secondary | ICD-10-CM | POA: Diagnosis not present

## 2021-08-18 DIAGNOSIS — F32A Depression, unspecified: Secondary | ICD-10-CM | POA: Diagnosis not present

## 2021-08-18 DIAGNOSIS — I69398 Other sequelae of cerebral infarction: Secondary | ICD-10-CM | POA: Diagnosis not present

## 2021-08-18 DIAGNOSIS — Z8673 Personal history of transient ischemic attack (TIA), and cerebral infarction without residual deficits: Secondary | ICD-10-CM | POA: Insufficient documentation

## 2021-08-18 DIAGNOSIS — Z0000 Encounter for general adult medical examination without abnormal findings: Secondary | ICD-10-CM | POA: Diagnosis not present

## 2021-08-18 DIAGNOSIS — I129 Hypertensive chronic kidney disease with stage 1 through stage 4 chronic kidney disease, or unspecified chronic kidney disease: Secondary | ICD-10-CM | POA: Diagnosis not present

## 2021-08-18 DIAGNOSIS — Z1389 Encounter for screening for other disorder: Secondary | ICD-10-CM | POA: Diagnosis not present

## 2021-09-24 DIAGNOSIS — I6930 Unspecified sequelae of cerebral infarction: Secondary | ICD-10-CM | POA: Diagnosis not present

## 2021-09-27 DIAGNOSIS — I6930 Unspecified sequelae of cerebral infarction: Secondary | ICD-10-CM | POA: Diagnosis not present

## 2021-10-18 DIAGNOSIS — I6930 Unspecified sequelae of cerebral infarction: Secondary | ICD-10-CM | POA: Diagnosis not present

## 2021-10-18 DIAGNOSIS — I639 Cerebral infarction, unspecified: Secondary | ICD-10-CM | POA: Diagnosis not present

## 2021-10-20 DIAGNOSIS — S92111D Displaced fracture of neck of right talus, subsequent encounter for fracture with routine healing: Secondary | ICD-10-CM | POA: Diagnosis not present

## 2021-10-20 DIAGNOSIS — W108XXD Fall (on) (from) other stairs and steps, subsequent encounter: Secondary | ICD-10-CM | POA: Diagnosis not present

## 2021-10-20 DIAGNOSIS — S92011D Displaced fracture of body of right calcaneus, subsequent encounter for fracture with routine healing: Secondary | ICD-10-CM | POA: Diagnosis not present

## 2021-10-20 DIAGNOSIS — Z7982 Long term (current) use of aspirin: Secondary | ICD-10-CM | POA: Diagnosis not present

## 2021-10-20 DIAGNOSIS — Z79899 Other long term (current) drug therapy: Secondary | ICD-10-CM | POA: Diagnosis not present

## 2021-10-29 DIAGNOSIS — I671 Cerebral aneurysm, nonruptured: Secondary | ICD-10-CM | POA: Diagnosis not present

## 2021-11-01 DIAGNOSIS — I6930 Unspecified sequelae of cerebral infarction: Secondary | ICD-10-CM | POA: Diagnosis not present

## 2021-12-14 DIAGNOSIS — Z09 Encounter for follow-up examination after completed treatment for conditions other than malignant neoplasm: Secondary | ICD-10-CM | POA: Diagnosis not present

## 2021-12-14 DIAGNOSIS — Z8673 Personal history of transient ischemic attack (TIA), and cerebral infarction without residual deficits: Secondary | ICD-10-CM | POA: Diagnosis not present

## 2021-12-17 DIAGNOSIS — H40003 Preglaucoma, unspecified, bilateral: Secondary | ICD-10-CM | POA: Diagnosis not present

## 2022-01-19 DIAGNOSIS — Z8673 Personal history of transient ischemic attack (TIA), and cerebral infarction without residual deficits: Secondary | ICD-10-CM | POA: Diagnosis not present

## 2022-01-19 DIAGNOSIS — Z09 Encounter for follow-up examination after completed treatment for conditions other than malignant neoplasm: Secondary | ICD-10-CM | POA: Diagnosis not present

## 2022-02-03 DIAGNOSIS — I639 Cerebral infarction, unspecified: Secondary | ICD-10-CM | POA: Diagnosis not present

## 2022-02-03 DIAGNOSIS — Z09 Encounter for follow-up examination after completed treatment for conditions other than malignant neoplasm: Secondary | ICD-10-CM | POA: Diagnosis not present

## 2022-02-03 DIAGNOSIS — Z8673 Personal history of transient ischemic attack (TIA), and cerebral infarction without residual deficits: Secondary | ICD-10-CM | POA: Diagnosis not present

## 2022-02-03 DIAGNOSIS — I499 Cardiac arrhythmia, unspecified: Secondary | ICD-10-CM | POA: Diagnosis not present

## 2022-02-08 DIAGNOSIS — N1831 Chronic kidney disease, stage 3a: Secondary | ICD-10-CM | POA: Diagnosis not present

## 2022-02-08 DIAGNOSIS — I10 Essential (primary) hypertension: Secondary | ICD-10-CM | POA: Diagnosis not present

## 2022-02-08 DIAGNOSIS — E7849 Other hyperlipidemia: Secondary | ICD-10-CM | POA: Diagnosis not present

## 2022-02-15 DIAGNOSIS — Z79891 Long term (current) use of opiate analgesic: Secondary | ICD-10-CM | POA: Diagnosis not present

## 2022-02-15 DIAGNOSIS — N183 Chronic kidney disease, stage 3 unspecified: Secondary | ICD-10-CM | POA: Diagnosis not present

## 2022-02-15 DIAGNOSIS — I129 Hypertensive chronic kidney disease with stage 1 through stage 4 chronic kidney disease, or unspecified chronic kidney disease: Secondary | ICD-10-CM | POA: Diagnosis not present

## 2022-02-15 DIAGNOSIS — E785 Hyperlipidemia, unspecified: Secondary | ICD-10-CM | POA: Diagnosis not present

## 2022-02-15 DIAGNOSIS — Z8673 Personal history of transient ischemic attack (TIA), and cerebral infarction without residual deficits: Secondary | ICD-10-CM | POA: Diagnosis not present

## 2022-02-15 DIAGNOSIS — F32A Depression, unspecified: Secondary | ICD-10-CM | POA: Diagnosis not present

## 2022-03-09 DIAGNOSIS — I639 Cerebral infarction, unspecified: Secondary | ICD-10-CM | POA: Diagnosis not present

## 2022-03-09 DIAGNOSIS — I472 Ventricular tachycardia, unspecified: Secondary | ICD-10-CM | POA: Diagnosis not present

## 2022-03-09 DIAGNOSIS — Z8673 Personal history of transient ischemic attack (TIA), and cerebral infarction without residual deficits: Secondary | ICD-10-CM | POA: Diagnosis not present

## 2022-03-09 DIAGNOSIS — E7849 Other hyperlipidemia: Secondary | ICD-10-CM | POA: Diagnosis not present

## 2022-03-09 DIAGNOSIS — I10 Essential (primary) hypertension: Secondary | ICD-10-CM | POA: Diagnosis not present

## 2022-05-14 DIAGNOSIS — Z95818 Presence of other cardiac implants and grafts: Secondary | ICD-10-CM | POA: Diagnosis not present

## 2022-06-09 DIAGNOSIS — Z95818 Presence of other cardiac implants and grafts: Secondary | ICD-10-CM | POA: Diagnosis not present

## 2022-06-14 DIAGNOSIS — Z8673 Personal history of transient ischemic attack (TIA), and cerebral infarction without residual deficits: Secondary | ICD-10-CM | POA: Diagnosis not present

## 2022-06-14 DIAGNOSIS — I720 Aneurysm of carotid artery: Secondary | ICD-10-CM | POA: Diagnosis not present

## 2022-06-20 DIAGNOSIS — H40003 Preglaucoma, unspecified, bilateral: Secondary | ICD-10-CM | POA: Diagnosis not present

## 2022-06-24 DIAGNOSIS — H53462 Homonymous bilateral field defects, left side: Secondary | ICD-10-CM | POA: Diagnosis not present

## 2022-06-24 DIAGNOSIS — Z961 Presence of intraocular lens: Secondary | ICD-10-CM | POA: Diagnosis not present

## 2022-06-24 DIAGNOSIS — H40003 Preglaucoma, unspecified, bilateral: Secondary | ICD-10-CM | POA: Diagnosis not present

## 2022-06-29 DIAGNOSIS — S92011D Displaced fracture of body of right calcaneus, subsequent encounter for fracture with routine healing: Secondary | ICD-10-CM | POA: Diagnosis not present

## 2022-06-29 DIAGNOSIS — M19071 Primary osteoarthritis, right ankle and foot: Secondary | ICD-10-CM | POA: Diagnosis not present

## 2022-06-29 DIAGNOSIS — M7731 Calcaneal spur, right foot: Secondary | ICD-10-CM | POA: Diagnosis not present

## 2022-06-29 DIAGNOSIS — W108XXD Fall (on) (from) other stairs and steps, subsequent encounter: Secondary | ICD-10-CM | POA: Diagnosis not present

## 2022-06-29 DIAGNOSIS — M85871 Other specified disorders of bone density and structure, right ankle and foot: Secondary | ICD-10-CM | POA: Diagnosis not present

## 2022-07-11 DIAGNOSIS — Z95818 Presence of other cardiac implants and grafts: Secondary | ICD-10-CM | POA: Diagnosis not present

## 2022-08-10 DIAGNOSIS — Z8673 Personal history of transient ischemic attack (TIA), and cerebral infarction without residual deficits: Secondary | ICD-10-CM | POA: Diagnosis not present

## 2022-08-10 DIAGNOSIS — I129 Hypertensive chronic kidney disease with stage 1 through stage 4 chronic kidney disease, or unspecified chronic kidney disease: Secondary | ICD-10-CM | POA: Diagnosis not present

## 2022-08-10 DIAGNOSIS — I499 Cardiac arrhythmia, unspecified: Secondary | ICD-10-CM | POA: Diagnosis not present

## 2022-08-10 DIAGNOSIS — E7849 Other hyperlipidemia: Secondary | ICD-10-CM | POA: Diagnosis not present

## 2022-08-10 DIAGNOSIS — I493 Ventricular premature depolarization: Secondary | ICD-10-CM | POA: Insufficient documentation

## 2022-08-10 DIAGNOSIS — Z4509 Encounter for adjustment and management of other cardiac device: Secondary | ICD-10-CM | POA: Diagnosis not present

## 2022-08-10 DIAGNOSIS — N1831 Chronic kidney disease, stage 3a: Secondary | ICD-10-CM | POA: Diagnosis not present

## 2022-08-10 DIAGNOSIS — Z95818 Presence of other cardiac implants and grafts: Secondary | ICD-10-CM | POA: Insufficient documentation

## 2022-08-15 DIAGNOSIS — Z79891 Long term (current) use of opiate analgesic: Secondary | ICD-10-CM | POA: Diagnosis not present

## 2022-08-15 DIAGNOSIS — I10 Essential (primary) hypertension: Secondary | ICD-10-CM | POA: Diagnosis not present

## 2022-08-15 DIAGNOSIS — E7849 Other hyperlipidemia: Secondary | ICD-10-CM | POA: Diagnosis not present

## 2022-08-15 DIAGNOSIS — N1831 Chronic kidney disease, stage 3a: Secondary | ICD-10-CM | POA: Diagnosis not present

## 2022-08-16 DIAGNOSIS — I10 Essential (primary) hypertension: Secondary | ICD-10-CM | POA: Diagnosis not present

## 2022-08-16 DIAGNOSIS — E7849 Other hyperlipidemia: Secondary | ICD-10-CM | POA: Diagnosis not present

## 2022-08-16 DIAGNOSIS — Z8673 Personal history of transient ischemic attack (TIA), and cerebral infarction without residual deficits: Secondary | ICD-10-CM | POA: Diagnosis not present

## 2022-08-16 DIAGNOSIS — Z79891 Long term (current) use of opiate analgesic: Secondary | ICD-10-CM | POA: Diagnosis not present

## 2022-08-16 DIAGNOSIS — N1831 Chronic kidney disease, stage 3a: Secondary | ICD-10-CM | POA: Diagnosis not present

## 2022-08-22 DIAGNOSIS — Z1331 Encounter for screening for depression: Secondary | ICD-10-CM | POA: Diagnosis not present

## 2022-08-22 DIAGNOSIS — F419 Anxiety disorder, unspecified: Secondary | ICD-10-CM | POA: Diagnosis not present

## 2022-08-22 DIAGNOSIS — I129 Hypertensive chronic kidney disease with stage 1 through stage 4 chronic kidney disease, or unspecified chronic kidney disease: Secondary | ICD-10-CM | POA: Diagnosis not present

## 2022-08-22 DIAGNOSIS — Z79891 Long term (current) use of opiate analgesic: Secondary | ICD-10-CM | POA: Diagnosis not present

## 2022-08-22 DIAGNOSIS — N183 Chronic kidney disease, stage 3 unspecified: Secondary | ICD-10-CM | POA: Diagnosis not present

## 2022-08-22 DIAGNOSIS — Z0000 Encounter for general adult medical examination without abnormal findings: Secondary | ICD-10-CM | POA: Diagnosis not present

## 2022-08-22 DIAGNOSIS — I6930 Unspecified sequelae of cerebral infarction: Secondary | ICD-10-CM | POA: Diagnosis not present

## 2022-08-22 DIAGNOSIS — F32A Depression, unspecified: Secondary | ICD-10-CM | POA: Diagnosis not present

## 2022-08-22 DIAGNOSIS — E785 Hyperlipidemia, unspecified: Secondary | ICD-10-CM | POA: Diagnosis not present

## 2022-08-24 ENCOUNTER — Encounter: Payer: Self-pay | Admitting: Podiatry

## 2022-08-24 ENCOUNTER — Ambulatory Visit: Payer: Medicare HMO | Admitting: Podiatry

## 2022-08-24 ENCOUNTER — Other Ambulatory Visit: Payer: Self-pay | Admitting: Podiatry

## 2022-08-24 ENCOUNTER — Ambulatory Visit (INDEPENDENT_AMBULATORY_CARE_PROVIDER_SITE_OTHER): Payer: Medicare HMO

## 2022-08-24 DIAGNOSIS — M7750 Other enthesopathy of unspecified foot: Secondary | ICD-10-CM

## 2022-08-24 DIAGNOSIS — M7662 Achilles tendinitis, left leg: Secondary | ICD-10-CM

## 2022-08-24 DIAGNOSIS — I10 Essential (primary) hypertension: Secondary | ICD-10-CM | POA: Insufficient documentation

## 2022-08-24 DIAGNOSIS — E785 Hyperlipidemia, unspecified: Secondary | ICD-10-CM | POA: Insufficient documentation

## 2022-08-24 MED ORDER — DEXAMETHASONE SODIUM PHOSPHATE 120 MG/30ML IJ SOLN
2.0000 mg | Freq: Once | INTRAMUSCULAR | Status: AC
Start: 2022-08-24 — End: 2022-08-24
  Administered 2022-08-24: 2 mg via INTRA_ARTICULAR

## 2022-08-24 MED ORDER — METHYLPREDNISOLONE 4 MG PO TBPK: ORAL_TABLET | ORAL | 0 refills | Status: DC

## 2022-08-24 NOTE — Progress Notes (Signed)
Subjective:  Patient ID: Barbara Jensen, female    DOB: Jun 14, 1940,  MRN: [id number] HPI Chief Complaint  Patient presents with   Foot Pain    Posterior heel left - aching x 2 months, puling, takes tramadol and gabapentin for pain  RIGHT: fall in Oct 2022, fracture talus and had surgery with plates and screws, had pain off and on since the surgery   New Patient (Initial Visit)    Est pt 2018    82 y.o. female presents with the above complaint.   ROS: Denies fever chills nausea vomit muscle aches pains calf pain back pain chest pain shortness of breath.  No past medical history on file. Past Surgical History:  Procedure Laterality Date   BREAST CYST ASPIRATION Left 2011    Current Outpatient Medications:    ALPRAZolam (XANAX) 0.5 MG tablet, Take 1 tablet by mouth 2 (two) times daily as needed., Disp: , Rfl:    gabapentin (NEURONTIN) 100 MG capsule, Take 1 capsule by mouth 2 (two) times daily., Disp: , Rfl:    methylPREDNISolone (MEDROL DOSEPAK) 4 MG TBPK tablet, 6 day dose pack - take as directed, Disp: 21 tablet, Rfl: 0   metoprolol succinate (TOPROL-XL) 25 MG 24 hr tablet, Take by mouth., Disp: , Rfl:    rosuvastatin (CRESTOR) 10 MG tablet, Take by mouth., Disp: , Rfl:    traMADol (ULTRAM) 50 MG tablet, Take by mouth., Disp: , Rfl:    acetaminophen (TYLENOL) 325 MG tablet, Take by mouth., Disp: , Rfl:    aspirin EC 81 MG tablet, Take by mouth., Disp: , Rfl:    calcium-vitamin D (SM CALCIUM 500/VITAMIN D3) 500-400 MG-UNIT tablet, Take by mouth., Disp: , Rfl:    chlorthalidone (HYGROTON) 25 MG tablet, Take 25 mg by mouth daily., Disp: , Rfl:    clorazepate (TRANXENE) 7.5 MG tablet, Take by mouth., Disp: , Rfl:    diclofenac (VOLTAREN) 75 MG EC tablet, Take 1 tablet (75 mg total) by mouth 2 (two) times daily., Disp: 60 tablet, Rfl: 1  Allergies  Allergen Reactions   Escitalopram Oxalate     Other reaction(s): Other (See Comments)  sedation   Iodine Dermatitis   Review of  Systems Objective:  There were no vitals filed for this visit.  General: Well developed, nourished, in no acute distress, alert and oriented x3   Dermatological: Skin is warm, dry and supple bilateral. Nails x 10 are well maintained; remaining integument appears unremarkable at this time. There are no open sores, no preulcerative lesions, no rash or signs of infection present.  Vascular: Dorsalis Pedis artery and Posterior Tibial artery pedal pulses are 2/4 bilateral with immedate capillary fill time. Pedal hair growth present. No varicosities and no lower extremity edema present bilateral.   Neruologic: Grossly intact via light touch bilateral. Vibratory intact via tuning fork bilateral. Protective threshold with Semmes Wienstein monofilament intact to all pedal sites bilateral. Patellar and Achilles deep tendon reflexes 2+ bilateral. No Babinski or clonus noted bilateral.   Musculoskeletal: No gross boney pedal deformities bilateral. No pain, crepitus, or limitation noted with foot and ankle range of motion bilateral. Muscular strength 5/5 in all groups tested bilateral.  Right foot demonstrates postsurgical changes consistent with cavus deformity and fixed inverted position secondary to open reduction internal fixation of a talar fracture.  Left foot is painful and warm to the area in the Achilles nontender on palpation to the plantar fascia.  No other symptomatic areas.  Gait: Unassisted, Nonantalgic.  Radiographs:  Left foot demonstrates an osseously mature foot with considerable demineralization of the foot.  Retrocalcaneal spurring with thickening of the Achilles is primary finding.  Assessment & Plan:   Assessment: Primary diagnosis is Achilles tendinitis left foot.  Plan: Discussed etiology pathology conservative surgical therapies at this point start her on methylprednisolone.  She has mild kidney failure so at this point I am not going to put her on NSAIDs.  I also injected the  palpable fluctuance in the posterior aspect of her left heel consistent with Achilles bursitis and tendinitis.  I injected this with 2 mg of dexamethasone and local anesthetic instructed her to wear her night splint which she already has at home and utilize ice therapy.  Follow-up with her in 6 to 8 weeks.     Rodolphe Edmonston T. [place], [place]

## 2022-09-20 DIAGNOSIS — I10 Essential (primary) hypertension: Secondary | ICD-10-CM | POA: Diagnosis not present

## 2022-09-20 DIAGNOSIS — Z8673 Personal history of transient ischemic attack (TIA), and cerebral infarction without residual deficits: Secondary | ICD-10-CM | POA: Diagnosis not present

## 2022-09-20 DIAGNOSIS — Z95818 Presence of other cardiac implants and grafts: Secondary | ICD-10-CM | POA: Diagnosis not present

## 2022-09-20 DIAGNOSIS — I493 Ventricular premature depolarization: Secondary | ICD-10-CM | POA: Diagnosis not present

## 2022-10-05 ENCOUNTER — Ambulatory Visit (INDEPENDENT_AMBULATORY_CARE_PROVIDER_SITE_OTHER): Payer: Medicare HMO | Admitting: Podiatry

## 2022-10-05 ENCOUNTER — Encounter: Payer: Self-pay | Admitting: Podiatry

## 2022-10-05 DIAGNOSIS — M7662 Achilles tendinitis, left leg: Secondary | ICD-10-CM

## 2022-10-05 MED ORDER — DEXAMETHASONE SODIUM PHOSPHATE 120 MG/30ML IJ SOLN
2.0000 mg | Freq: Once | INTRAMUSCULAR | Status: AC
Start: 2022-10-05 — End: 2022-10-05
  Administered 2022-10-05: 2 mg via INTRA_ARTICULAR

## 2022-10-05 MED ORDER — PREDNISONE 10 MG (21) PO TBPK: ORAL_TABLET | ORAL | 0 refills | Status: AC

## 2022-10-06 NOTE — Progress Notes (Signed)
She presents today for follow-up of her Achilles tendinitis of her left leg she states that is really not better I feel like it is starting to run up the back of the leg now.  She states that the bottom of the foot feels better.  Objective: Vital signs are stable alert oriented x 3 warmth and edema along the posterior aspect of the calcaneus left.  Moderately tender.  The calf is not warm to the touch and is not firm and there is no specific point of tenderness on the calf.  Assessment: Insertional Achilles tendinitis.  Plan: After thorough discussion we injected a small amount of dexamethasone 2 mg beneath the skin at the level of the insertion.  If this does not render her asymptomatic may need to consider aggressive physical therapy or surgery.

## 2022-10-10 DIAGNOSIS — J069 Acute upper respiratory infection, unspecified: Secondary | ICD-10-CM | POA: Diagnosis not present

## 2022-10-10 DIAGNOSIS — I10 Essential (primary) hypertension: Secondary | ICD-10-CM | POA: Diagnosis not present

## 2022-10-10 DIAGNOSIS — N1831 Chronic kidney disease, stage 3a: Secondary | ICD-10-CM | POA: Diagnosis not present

## 2022-10-10 DIAGNOSIS — Z8673 Personal history of transient ischemic attack (TIA), and cerebral infarction without residual deficits: Secondary | ICD-10-CM | POA: Diagnosis not present

## 2022-10-10 DIAGNOSIS — U071 COVID-19: Secondary | ICD-10-CM | POA: Diagnosis not present

## 2022-10-25 DIAGNOSIS — Z8673 Personal history of transient ischemic attack (TIA), and cerebral infarction without residual deficits: Secondary | ICD-10-CM | POA: Diagnosis not present

## 2022-10-25 DIAGNOSIS — I493 Ventricular premature depolarization: Secondary | ICD-10-CM | POA: Diagnosis not present

## 2022-10-25 DIAGNOSIS — I10 Essential (primary) hypertension: Secondary | ICD-10-CM | POA: Diagnosis not present

## 2022-10-25 DIAGNOSIS — Z95818 Presence of other cardiac implants and grafts: Secondary | ICD-10-CM | POA: Diagnosis not present

## 2022-11-16 ENCOUNTER — Encounter: Payer: Self-pay | Admitting: Podiatry

## 2022-11-16 ENCOUNTER — Ambulatory Visit: Payer: Medicare HMO | Admitting: Podiatry

## 2022-11-16 DIAGNOSIS — S86012A Strain of left Achilles tendon, initial encounter: Secondary | ICD-10-CM

## 2022-11-16 MED ORDER — METHYLPREDNISOLONE 4 MG PO TBPK: ORAL_TABLET | ORAL | 0 refills | Status: AC

## 2022-11-16 MED ORDER — MELOXICAM 15 MG PO TABS: 15.0000 mg | ORAL_TABLET | Freq: Every day | ORAL | 3 refills | Status: AC

## 2022-11-16 NOTE — Progress Notes (Signed)
She presents today states that the left heel is really hurting is her hurting so badly that I can hardly get around.  She states is starting to affect her ability to perform activities of daily living she states that she cannot go out and do things like she wants to.  She states that is making my right foot which is deformed hurt even worse.  Objective: Vital signs are stable she is alert and oriented x 3.  Pulses are palpable.  She has severe pain on palpation of the posterior aspect of the calcaneus and the Achilles which is warm and mildly erythematous there appears to be fluctuance underneath the skin.  I do think that this is probably a tear of the insertional component of the Achilles tendon also possibly retro-Achilles bursitis.  Assessment: Probable tear of the Achilles at its insertion.  Plan: Conservative therapies have failed to render this patient asymptomatic and at this point appears to be worsening.  We would like to request an MRI for surgical consideration and differential diagnoses which would either direct [place] to physical therapy or to surgical intervention.

## 2022-11-22 ENCOUNTER — Telehealth: Payer: Self-pay | Admitting: Podiatry

## 2022-11-22 NOTE — Telephone Encounter (Signed)
Received vm today at 1224pm from pt wanting Dr Al Corpus or his nurse to call her about the achilles pain she is having. She is trying to get a mri done. Please call pt

## 2022-11-23 DIAGNOSIS — M7662 Achilles tendinitis, left leg: Secondary | ICD-10-CM | POA: Diagnosis not present

## 2022-11-23 DIAGNOSIS — Y929 Unspecified place or not applicable: Secondary | ICD-10-CM | POA: Diagnosis not present

## 2022-11-23 DIAGNOSIS — Y33XXXA Other specified events, undetermined intent, initial encounter: Secondary | ICD-10-CM | POA: Diagnosis not present

## 2022-11-23 DIAGNOSIS — M7732 Calcaneal spur, left foot: Secondary | ICD-10-CM | POA: Diagnosis not present

## 2022-11-23 DIAGNOSIS — M79672 Pain in left foot: Secondary | ICD-10-CM | POA: Diagnosis not present

## 2022-11-23 DIAGNOSIS — M19072 Primary osteoarthritis, left ankle and foot: Secondary | ICD-10-CM | POA: Diagnosis not present

## 2022-11-23 DIAGNOSIS — M67874 Other specified disorders of tendon, left ankle and foot: Secondary | ICD-10-CM | POA: Diagnosis not present

## 2022-11-23 DIAGNOSIS — X58XXXA Exposure to other specified factors, initial encounter: Secondary | ICD-10-CM | POA: Diagnosis not present

## 2022-11-29 DIAGNOSIS — E785 Hyperlipidemia, unspecified: Secondary | ICD-10-CM | POA: Diagnosis not present

## 2022-11-29 DIAGNOSIS — F419 Anxiety disorder, unspecified: Secondary | ICD-10-CM | POA: Diagnosis not present

## 2022-11-29 DIAGNOSIS — Z8673 Personal history of transient ischemic attack (TIA), and cerebral infarction without residual deficits: Secondary | ICD-10-CM | POA: Diagnosis not present

## 2022-11-29 DIAGNOSIS — I493 Ventricular premature depolarization: Secondary | ICD-10-CM | POA: Diagnosis not present

## 2022-11-29 DIAGNOSIS — I10 Essential (primary) hypertension: Secondary | ICD-10-CM | POA: Diagnosis not present

## 2022-11-29 DIAGNOSIS — Z95818 Presence of other cardiac implants and grafts: Secondary | ICD-10-CM | POA: Diagnosis not present

## 2022-11-30 DIAGNOSIS — M7989 Other specified soft tissue disorders: Secondary | ICD-10-CM | POA: Diagnosis not present

## 2022-11-30 DIAGNOSIS — M6528 Calcific tendinitis, other site: Secondary | ICD-10-CM | POA: Diagnosis not present

## 2022-12-05 ENCOUNTER — Ambulatory Visit
Admission: RE | Admit: 2022-12-05 | Discharge: 2022-12-05 | Disposition: A | Discharge status: Home / Self Care | Payer: Medicare HMO | Source: Ambulatory Visit | Attending: Podiatry | Admitting: Podiatry

## 2022-12-05 DIAGNOSIS — M25572 Pain in left ankle and joints of left foot: Secondary | ICD-10-CM | POA: Diagnosis not present

## 2022-12-05 DIAGNOSIS — S86012A Strain of left Achilles tendon, initial encounter: Secondary | ICD-10-CM

## 2022-12-14 ENCOUNTER — Ambulatory Visit: Payer: Medicare HMO | Admitting: Podiatry

## 2022-12-16 DIAGNOSIS — X500XXA Overexertion from strenuous movement or load, initial encounter: Secondary | ICD-10-CM | POA: Diagnosis not present

## 2022-12-16 DIAGNOSIS — S86012A Strain of left Achilles tendon, initial encounter: Secondary | ICD-10-CM | POA: Diagnosis not present

## 2022-12-16 DIAGNOSIS — M7660 Achilles tendinitis, unspecified leg: Secondary | ICD-10-CM | POA: Diagnosis not present

## 2022-12-26 ENCOUNTER — Ambulatory Visit: Payer: Medicare HMO | Admitting: Podiatry

## 2022-12-30 ENCOUNTER — Telehealth: Payer: Self-pay | Admitting: Podiatry

## 2022-12-30 NOTE — Telephone Encounter (Signed)
Called pt to get her scheduled and she was in so much pain she went and got the mri on a disc and took it to an orthopedic doctor at [facility] and is following up with her. She said you were a wonderful doctor but she was in so much pain she had to do something. She said you did not have results when she called for her last appt.

## 2023-01-03 DIAGNOSIS — Z95818 Presence of other cardiac implants and grafts: Secondary | ICD-10-CM | POA: Diagnosis not present

## 2023-01-20 DIAGNOSIS — S86012D Strain of left Achilles tendon, subsequent encounter: Secondary | ICD-10-CM | POA: Diagnosis not present

## 2023-01-20 DIAGNOSIS — X500XXD Overexertion from strenuous movement or load, subsequent encounter: Secondary | ICD-10-CM | POA: Diagnosis not present

## 2023-02-07 DIAGNOSIS — I10 Essential (primary) hypertension: Secondary | ICD-10-CM | POA: Diagnosis not present

## 2023-02-07 DIAGNOSIS — Z95818 Presence of other cardiac implants and grafts: Secondary | ICD-10-CM | POA: Diagnosis not present

## 2023-02-07 DIAGNOSIS — Z8673 Personal history of transient ischemic attack (TIA), and cerebral infarction without residual deficits: Secondary | ICD-10-CM | POA: Diagnosis not present

## 2023-02-07 DIAGNOSIS — E785 Hyperlipidemia, unspecified: Secondary | ICD-10-CM | POA: Diagnosis not present

## 2023-02-07 DIAGNOSIS — I493 Ventricular premature depolarization: Secondary | ICD-10-CM | POA: Diagnosis not present

## 2023-02-07 DIAGNOSIS — F419 Anxiety disorder, unspecified: Secondary | ICD-10-CM | POA: Diagnosis not present

## 2023-02-10 DIAGNOSIS — R2681 Unsteadiness on feet: Secondary | ICD-10-CM | POA: Diagnosis not present

## 2023-02-10 DIAGNOSIS — M25571 Pain in right ankle and joints of right foot: Secondary | ICD-10-CM | POA: Diagnosis not present

## 2023-02-10 DIAGNOSIS — M25572 Pain in left ankle and joints of left foot: Secondary | ICD-10-CM | POA: Diagnosis not present

## 2023-02-10 DIAGNOSIS — M25671 Stiffness of right ankle, not elsewhere classified: Secondary | ICD-10-CM | POA: Diagnosis not present

## 2023-02-10 DIAGNOSIS — M25672 Stiffness of left ankle, not elsewhere classified: Secondary | ICD-10-CM | POA: Diagnosis not present

## 2023-02-10 DIAGNOSIS — R2689 Other abnormalities of gait and mobility: Secondary | ICD-10-CM | POA: Diagnosis not present

## 2023-02-15 DIAGNOSIS — N1831 Chronic kidney disease, stage 3a: Secondary | ICD-10-CM | POA: Diagnosis not present

## 2023-02-15 DIAGNOSIS — E7849 Other hyperlipidemia: Secondary | ICD-10-CM | POA: Diagnosis not present

## 2023-02-15 DIAGNOSIS — Z79891 Long term (current) use of opiate analgesic: Secondary | ICD-10-CM | POA: Diagnosis not present

## 2023-02-15 DIAGNOSIS — Z8673 Personal history of transient ischemic attack (TIA), and cerebral infarction without residual deficits: Secondary | ICD-10-CM | POA: Diagnosis not present

## 2023-02-15 DIAGNOSIS — I10 Essential (primary) hypertension: Secondary | ICD-10-CM | POA: Diagnosis not present

## 2023-02-16 DIAGNOSIS — R2681 Unsteadiness on feet: Secondary | ICD-10-CM | POA: Diagnosis not present

## 2023-02-16 DIAGNOSIS — M25672 Stiffness of left ankle, not elsewhere classified: Secondary | ICD-10-CM | POA: Diagnosis not present

## 2023-02-16 DIAGNOSIS — M25671 Stiffness of right ankle, not elsewhere classified: Secondary | ICD-10-CM | POA: Diagnosis not present

## 2023-02-16 DIAGNOSIS — M25572 Pain in left ankle and joints of left foot: Secondary | ICD-10-CM | POA: Diagnosis not present

## 2023-02-16 DIAGNOSIS — R2689 Other abnormalities of gait and mobility: Secondary | ICD-10-CM | POA: Diagnosis not present

## 2023-02-16 DIAGNOSIS — M25571 Pain in right ankle and joints of right foot: Secondary | ICD-10-CM | POA: Diagnosis not present

## 2023-02-17 DIAGNOSIS — Z981 Arthrodesis status: Secondary | ICD-10-CM | POA: Diagnosis not present

## 2023-02-17 DIAGNOSIS — M25571 Pain in right ankle and joints of right foot: Secondary | ICD-10-CM | POA: Diagnosis not present

## 2023-02-17 DIAGNOSIS — S86012D Strain of left Achilles tendon, subsequent encounter: Secondary | ICD-10-CM | POA: Diagnosis not present

## 2023-02-17 DIAGNOSIS — S92011D Displaced fracture of body of right calcaneus, subsequent encounter for fracture with routine healing: Secondary | ICD-10-CM | POA: Diagnosis not present

## 2023-02-17 DIAGNOSIS — X58XXXD Exposure to other specified factors, subsequent encounter: Secondary | ICD-10-CM | POA: Diagnosis not present

## 2023-02-17 DIAGNOSIS — M19171 Post-traumatic osteoarthritis, right ankle and foot: Secondary | ICD-10-CM | POA: Diagnosis not present

## 2023-02-22 DIAGNOSIS — I129 Hypertensive chronic kidney disease with stage 1 through stage 4 chronic kidney disease, or unspecified chronic kidney disease: Secondary | ICD-10-CM | POA: Diagnosis not present

## 2023-02-22 DIAGNOSIS — Z79891 Long term (current) use of opiate analgesic: Secondary | ICD-10-CM | POA: Diagnosis not present

## 2023-02-22 DIAGNOSIS — N1831 Chronic kidney disease, stage 3a: Secondary | ICD-10-CM | POA: Diagnosis not present

## 2023-02-22 DIAGNOSIS — F32A Depression, unspecified: Secondary | ICD-10-CM | POA: Diagnosis not present

## 2023-02-22 DIAGNOSIS — Z8673 Personal history of transient ischemic attack (TIA), and cerebral infarction without residual deficits: Secondary | ICD-10-CM | POA: Diagnosis not present

## 2023-02-22 DIAGNOSIS — Z131 Encounter for screening for diabetes mellitus: Secondary | ICD-10-CM | POA: Diagnosis not present

## 2023-02-22 DIAGNOSIS — F419 Anxiety disorder, unspecified: Secondary | ICD-10-CM | POA: Diagnosis not present

## 2023-02-22 DIAGNOSIS — E785 Hyperlipidemia, unspecified: Secondary | ICD-10-CM | POA: Diagnosis not present

## 2023-02-23 DIAGNOSIS — R2689 Other abnormalities of gait and mobility: Secondary | ICD-10-CM | POA: Diagnosis not present

## 2023-02-23 DIAGNOSIS — M25571 Pain in right ankle and joints of right foot: Secondary | ICD-10-CM | POA: Diagnosis not present

## 2023-02-23 DIAGNOSIS — M25671 Stiffness of right ankle, not elsewhere classified: Secondary | ICD-10-CM | POA: Diagnosis not present

## 2023-02-23 DIAGNOSIS — R2681 Unsteadiness on feet: Secondary | ICD-10-CM | POA: Diagnosis not present

## 2023-02-23 DIAGNOSIS — M25572 Pain in left ankle and joints of left foot: Secondary | ICD-10-CM | POA: Diagnosis not present

## 2023-02-23 DIAGNOSIS — M25672 Stiffness of left ankle, not elsewhere classified: Secondary | ICD-10-CM | POA: Diagnosis not present

## 2023-03-02 DIAGNOSIS — R2689 Other abnormalities of gait and mobility: Secondary | ICD-10-CM | POA: Diagnosis not present

## 2023-03-02 DIAGNOSIS — M25672 Stiffness of left ankle, not elsewhere classified: Secondary | ICD-10-CM | POA: Diagnosis not present

## 2023-03-02 DIAGNOSIS — M25571 Pain in right ankle and joints of right foot: Secondary | ICD-10-CM | POA: Diagnosis not present

## 2023-03-02 DIAGNOSIS — R2681 Unsteadiness on feet: Secondary | ICD-10-CM | POA: Diagnosis not present

## 2023-03-02 DIAGNOSIS — M25671 Stiffness of right ankle, not elsewhere classified: Secondary | ICD-10-CM | POA: Diagnosis not present

## 2023-03-02 DIAGNOSIS — M25572 Pain in left ankle and joints of left foot: Secondary | ICD-10-CM | POA: Diagnosis not present

## 2023-03-15 DIAGNOSIS — Z95818 Presence of other cardiac implants and grafts: Secondary | ICD-10-CM | POA: Diagnosis not present

## 2023-03-15 DIAGNOSIS — F419 Anxiety disorder, unspecified: Secondary | ICD-10-CM | POA: Diagnosis not present

## 2023-03-15 DIAGNOSIS — Z8673 Personal history of transient ischemic attack (TIA), and cerebral infarction without residual deficits: Secondary | ICD-10-CM | POA: Diagnosis not present

## 2023-03-15 DIAGNOSIS — I493 Ventricular premature depolarization: Secondary | ICD-10-CM | POA: Diagnosis not present

## 2023-03-15 DIAGNOSIS — E785 Hyperlipidemia, unspecified: Secondary | ICD-10-CM | POA: Diagnosis not present

## 2023-03-15 DIAGNOSIS — I10 Essential (primary) hypertension: Secondary | ICD-10-CM | POA: Diagnosis not present

## 2023-03-16 DIAGNOSIS — R2681 Unsteadiness on feet: Secondary | ICD-10-CM | POA: Diagnosis not present

## 2023-03-16 DIAGNOSIS — M25571 Pain in right ankle and joints of right foot: Secondary | ICD-10-CM | POA: Diagnosis not present

## 2023-03-16 DIAGNOSIS — M25672 Stiffness of left ankle, not elsewhere classified: Secondary | ICD-10-CM | POA: Diagnosis not present

## 2023-03-16 DIAGNOSIS — M25572 Pain in left ankle and joints of left foot: Secondary | ICD-10-CM | POA: Diagnosis not present

## 2023-03-16 DIAGNOSIS — R2689 Other abnormalities of gait and mobility: Secondary | ICD-10-CM | POA: Diagnosis not present

## 2023-03-16 DIAGNOSIS — M25671 Stiffness of right ankle, not elsewhere classified: Secondary | ICD-10-CM | POA: Diagnosis not present

## 2023-03-31 DIAGNOSIS — S92011D Displaced fracture of body of right calcaneus, subsequent encounter for fracture with routine healing: Secondary | ICD-10-CM | POA: Diagnosis not present

## 2023-03-31 DIAGNOSIS — X500XXD Overexertion from strenuous movement or load, subsequent encounter: Secondary | ICD-10-CM | POA: Diagnosis not present

## 2023-03-31 DIAGNOSIS — S86012D Strain of left Achilles tendon, subsequent encounter: Secondary | ICD-10-CM | POA: Diagnosis not present

## 2023-04-18 ENCOUNTER — Encounter: Payer: Self-pay | Admitting: Internal Medicine

## 2023-04-18 DIAGNOSIS — Z1231 Encounter for screening mammogram for malignant neoplasm of breast: Secondary | ICD-10-CM

## 2023-04-18 DIAGNOSIS — Z95818 Presence of other cardiac implants and grafts: Secondary | ICD-10-CM | POA: Diagnosis not present

## 2023-04-21 ENCOUNTER — Other Ambulatory Visit: Payer: Self-pay | Admitting: Internal Medicine

## 2023-04-21 DIAGNOSIS — N644 Mastodynia: Secondary | ICD-10-CM | POA: Diagnosis not present

## 2023-04-21 DIAGNOSIS — I129 Hypertensive chronic kidney disease with stage 1 through stage 4 chronic kidney disease, or unspecified chronic kidney disease: Secondary | ICD-10-CM | POA: Diagnosis not present

## 2023-04-21 DIAGNOSIS — I493 Ventricular premature depolarization: Secondary | ICD-10-CM | POA: Diagnosis not present

## 2023-04-21 DIAGNOSIS — N1831 Chronic kidney disease, stage 3a: Secondary | ICD-10-CM | POA: Diagnosis not present

## 2023-04-21 DIAGNOSIS — E785 Hyperlipidemia, unspecified: Secondary | ICD-10-CM | POA: Diagnosis not present

## 2023-04-21 DIAGNOSIS — Z2821 Immunization not carried out because of patient refusal: Secondary | ICD-10-CM | POA: Diagnosis not present

## 2023-04-28 ENCOUNTER — Ambulatory Visit
Admission: RE | Admit: 2023-04-28 | Discharge: 2023-04-28 | Disposition: A | Discharge status: Home / Self Care | Source: Ambulatory Visit | Attending: Internal Medicine | Admitting: Internal Medicine

## 2023-04-28 DIAGNOSIS — N644 Mastodynia: Secondary | ICD-10-CM | POA: Diagnosis not present

## 2023-04-28 DIAGNOSIS — R92323 Mammographic fibroglandular density, bilateral breasts: Secondary | ICD-10-CM | POA: Diagnosis not present

## 2023-04-28 DIAGNOSIS — N6311 Unspecified lump in the right breast, upper outer quadrant: Secondary | ICD-10-CM | POA: Diagnosis not present

## 2023-05-02 ENCOUNTER — Other Ambulatory Visit: Payer: Self-pay | Admitting: Internal Medicine

## 2023-05-02 DIAGNOSIS — R928 Other abnormal and inconclusive findings on diagnostic imaging of breast: Secondary | ICD-10-CM

## 2023-05-03 ENCOUNTER — Ambulatory Visit
Admission: RE | Admit: 2023-05-03 | Discharge: 2023-05-03 | Disposition: A | Discharge status: Home / Self Care | Source: Ambulatory Visit | Attending: Internal Medicine | Admitting: Internal Medicine

## 2023-05-03 DIAGNOSIS — C50511 Malignant neoplasm of lower-outer quadrant of right female breast: Secondary | ICD-10-CM | POA: Diagnosis not present

## 2023-05-03 DIAGNOSIS — R928 Other abnormal and inconclusive findings on diagnostic imaging of breast: Secondary | ICD-10-CM | POA: Insufficient documentation

## 2023-05-03 DIAGNOSIS — N6313 Unspecified lump in the right breast, lower outer quadrant: Secondary | ICD-10-CM | POA: Diagnosis not present

## 2023-05-03 DIAGNOSIS — Z170 Estrogen receptor positive status [ER+]: Secondary | ICD-10-CM | POA: Diagnosis not present

## 2023-05-03 HISTORY — PX: BREAST BIOPSY: SHX20

## 2023-05-03 MED ORDER — LIDOCAINE 1 % OPTIME INJ - NO CHARGE
2.0000 mL | Freq: Once | INTRAMUSCULAR | Status: AC
Start: 2023-05-03 — End: 2023-05-03
  Administered 2023-05-03: 2 mL
  Filled 2023-05-03: qty 2

## 2023-05-03 MED ORDER — LIDOCAINE-EPINEPHRINE 1 %-1:100000 IJ SOLN
8.0000 mL | Freq: Once | INTRAMUSCULAR | Status: AC
  Administered 2023-05-03: 8 mL
  Filled 2023-05-03: qty 8

## 2023-05-04 DIAGNOSIS — L578 Other skin changes due to chronic exposure to nonionizing radiation: Secondary | ICD-10-CM | POA: Diagnosis not present

## 2023-05-04 DIAGNOSIS — L538 Other specified erythematous conditions: Secondary | ICD-10-CM | POA: Diagnosis not present

## 2023-05-04 DIAGNOSIS — L820 Inflamed seborrheic keratosis: Secondary | ICD-10-CM | POA: Diagnosis not present

## 2023-05-04 DIAGNOSIS — X32XXXA Exposure to sunlight, initial encounter: Secondary | ICD-10-CM | POA: Diagnosis not present

## 2023-05-04 DIAGNOSIS — R208 Other disturbances of skin sensation: Secondary | ICD-10-CM | POA: Diagnosis not present

## 2023-05-04 DIAGNOSIS — L570 Actinic keratosis: Secondary | ICD-10-CM | POA: Diagnosis not present

## 2023-05-04 DIAGNOSIS — D485 Neoplasm of uncertain behavior of skin: Secondary | ICD-10-CM | POA: Diagnosis not present

## 2023-05-05 LAB — SURGICAL PATHOLOGY

## 2023-05-17 ENCOUNTER — Encounter

## 2023-05-17 ENCOUNTER — Other Ambulatory Visit

## 2023-05-17 DIAGNOSIS — C50511 Malignant neoplasm of lower-outer quadrant of right female breast: Secondary | ICD-10-CM | POA: Diagnosis not present

## 2023-05-23 DIAGNOSIS — Z95818 Presence of other cardiac implants and grafts: Secondary | ICD-10-CM | POA: Diagnosis not present

## 2023-05-23 DIAGNOSIS — I493 Ventricular premature depolarization: Secondary | ICD-10-CM | POA: Diagnosis not present

## 2023-05-23 DIAGNOSIS — E785 Hyperlipidemia, unspecified: Secondary | ICD-10-CM | POA: Diagnosis not present

## 2023-05-23 DIAGNOSIS — Z8673 Personal history of transient ischemic attack (TIA), and cerebral infarction without residual deficits: Secondary | ICD-10-CM | POA: Diagnosis not present

## 2023-05-23 DIAGNOSIS — F419 Anxiety disorder, unspecified: Secondary | ICD-10-CM | POA: Diagnosis not present

## 2023-05-23 DIAGNOSIS — I10 Essential (primary) hypertension: Secondary | ICD-10-CM | POA: Diagnosis not present

## 2023-05-24 DIAGNOSIS — N6310 Unspecified lump in the right breast, unspecified quadrant: Secondary | ICD-10-CM | POA: Diagnosis not present

## 2023-05-24 DIAGNOSIS — C50911 Malignant neoplasm of unspecified site of right female breast: Secondary | ICD-10-CM | POA: Diagnosis not present

## 2023-05-24 DIAGNOSIS — C50511 Malignant neoplasm of lower-outer quadrant of right female breast: Secondary | ICD-10-CM | POA: Diagnosis not present

## 2023-05-24 DIAGNOSIS — Z170 Estrogen receptor positive status [ER+]: Secondary | ICD-10-CM | POA: Diagnosis not present

## 2023-05-26 DIAGNOSIS — C50911 Malignant neoplasm of unspecified site of right female breast: Secondary | ICD-10-CM | POA: Diagnosis not present

## 2023-06-14 DIAGNOSIS — R92321 Mammographic fibroglandular density, right breast: Secondary | ICD-10-CM | POA: Diagnosis not present

## 2023-06-14 DIAGNOSIS — N6310 Unspecified lump in the right breast, unspecified quadrant: Secondary | ICD-10-CM | POA: Diagnosis not present

## 2023-06-14 DIAGNOSIS — I493 Ventricular premature depolarization: Secondary | ICD-10-CM | POA: Diagnosis not present

## 2023-06-14 DIAGNOSIS — C50911 Malignant neoplasm of unspecified site of right female breast: Secondary | ICD-10-CM | POA: Diagnosis not present

## 2023-06-14 DIAGNOSIS — I10 Essential (primary) hypertension: Secondary | ICD-10-CM | POA: Diagnosis not present

## 2023-06-14 DIAGNOSIS — F419 Anxiety disorder, unspecified: Secondary | ICD-10-CM | POA: Diagnosis not present

## 2023-06-14 DIAGNOSIS — Z95818 Presence of other cardiac implants and grafts: Secondary | ICD-10-CM | POA: Diagnosis not present

## 2023-06-14 DIAGNOSIS — N6313 Unspecified lump in the right breast, lower outer quadrant: Secondary | ICD-10-CM | POA: Diagnosis not present

## 2023-06-15 DIAGNOSIS — E785 Hyperlipidemia, unspecified: Secondary | ICD-10-CM | POA: Diagnosis not present

## 2023-06-15 DIAGNOSIS — F419 Anxiety disorder, unspecified: Secondary | ICD-10-CM | POA: Diagnosis not present

## 2023-06-15 DIAGNOSIS — C50511 Malignant neoplasm of lower-outer quadrant of right female breast: Secondary | ICD-10-CM | POA: Diagnosis not present

## 2023-06-15 DIAGNOSIS — N1831 Chronic kidney disease, stage 3a: Secondary | ICD-10-CM | POA: Diagnosis not present

## 2023-06-15 DIAGNOSIS — Z170 Estrogen receptor positive status [ER+]: Secondary | ICD-10-CM | POA: Diagnosis not present

## 2023-06-15 DIAGNOSIS — I129 Hypertensive chronic kidney disease with stage 1 through stage 4 chronic kidney disease, or unspecified chronic kidney disease: Secondary | ICD-10-CM | POA: Diagnosis not present

## 2023-06-15 DIAGNOSIS — I493 Ventricular premature depolarization: Secondary | ICD-10-CM | POA: Diagnosis not present

## 2023-06-15 DIAGNOSIS — E66812 Obesity, class 2: Secondary | ICD-10-CM | POA: Diagnosis not present

## 2023-06-15 DIAGNOSIS — I10 Essential (primary) hypertension: Secondary | ICD-10-CM | POA: Diagnosis not present

## 2023-06-15 DIAGNOSIS — R54 Age-related physical debility: Secondary | ICD-10-CM | POA: Diagnosis not present

## 2023-06-27 DIAGNOSIS — I493 Ventricular premature depolarization: Secondary | ICD-10-CM | POA: Diagnosis not present

## 2023-06-27 DIAGNOSIS — Z8673 Personal history of transient ischemic attack (TIA), and cerebral infarction without residual deficits: Secondary | ICD-10-CM | POA: Diagnosis not present

## 2023-06-27 DIAGNOSIS — F419 Anxiety disorder, unspecified: Secondary | ICD-10-CM | POA: Diagnosis not present

## 2023-06-27 DIAGNOSIS — I10 Essential (primary) hypertension: Secondary | ICD-10-CM | POA: Diagnosis not present

## 2023-06-27 DIAGNOSIS — E785 Hyperlipidemia, unspecified: Secondary | ICD-10-CM | POA: Diagnosis not present

## 2023-06-27 DIAGNOSIS — Z95818 Presence of other cardiac implants and grafts: Secondary | ICD-10-CM | POA: Diagnosis not present

## 2023-06-28 DIAGNOSIS — Z853 Personal history of malignant neoplasm of breast: Secondary | ICD-10-CM | POA: Diagnosis not present

## 2023-06-28 DIAGNOSIS — C50911 Malignant neoplasm of unspecified site of right female breast: Secondary | ICD-10-CM | POA: Diagnosis not present

## 2023-06-28 DIAGNOSIS — Z1231 Encounter for screening mammogram for malignant neoplasm of breast: Secondary | ICD-10-CM | POA: Diagnosis not present

## 2023-07-05 DIAGNOSIS — Z170 Estrogen receptor positive status [ER+]: Secondary | ICD-10-CM | POA: Diagnosis not present

## 2023-07-05 DIAGNOSIS — C50811 Malignant neoplasm of overlapping sites of right female breast: Secondary | ICD-10-CM | POA: Diagnosis not present

## 2023-07-05 DIAGNOSIS — C50919 Malignant neoplasm of unspecified site of unspecified female breast: Secondary | ICD-10-CM | POA: Diagnosis not present

## 2023-07-05 DIAGNOSIS — C50911 Malignant neoplasm of unspecified site of right female breast: Secondary | ICD-10-CM | POA: Diagnosis not present

## 2023-07-18 DIAGNOSIS — H40003 Preglaucoma, unspecified, bilateral: Secondary | ICD-10-CM | POA: Diagnosis not present

## 2023-07-24 DIAGNOSIS — C50511 Malignant neoplasm of lower-outer quadrant of right female breast: Secondary | ICD-10-CM | POA: Diagnosis not present

## 2023-07-24 DIAGNOSIS — C50911 Malignant neoplasm of unspecified site of right female breast: Secondary | ICD-10-CM | POA: Diagnosis not present

## 2023-08-01 DIAGNOSIS — H40003 Preglaucoma, unspecified, bilateral: Secondary | ICD-10-CM | POA: Diagnosis not present

## 2023-08-01 DIAGNOSIS — F419 Anxiety disorder, unspecified: Secondary | ICD-10-CM | POA: Diagnosis not present

## 2023-08-01 DIAGNOSIS — Z8673 Personal history of transient ischemic attack (TIA), and cerebral infarction without residual deficits: Secondary | ICD-10-CM | POA: Diagnosis not present

## 2023-08-01 DIAGNOSIS — E785 Hyperlipidemia, unspecified: Secondary | ICD-10-CM | POA: Diagnosis not present

## 2023-08-01 DIAGNOSIS — I493 Ventricular premature depolarization: Secondary | ICD-10-CM | POA: Diagnosis not present

## 2023-08-01 DIAGNOSIS — H43813 Vitreous degeneration, bilateral: Secondary | ICD-10-CM | POA: Diagnosis not present

## 2023-08-01 DIAGNOSIS — I10 Essential (primary) hypertension: Secondary | ICD-10-CM | POA: Diagnosis not present

## 2023-08-01 DIAGNOSIS — Z95818 Presence of other cardiac implants and grafts: Secondary | ICD-10-CM | POA: Diagnosis not present

## 2023-08-01 DIAGNOSIS — Z961 Presence of intraocular lens: Secondary | ICD-10-CM | POA: Diagnosis not present

## 2023-08-01 DIAGNOSIS — Z0100 Encounter for examination of eyes and vision without abnormal findings: Secondary | ICD-10-CM | POA: Diagnosis not present

## 2023-08-01 DIAGNOSIS — H53462 Homonymous bilateral field defects, left side: Secondary | ICD-10-CM | POA: Diagnosis not present

## 2023-08-02 DIAGNOSIS — M216X1 Other acquired deformities of right foot: Secondary | ICD-10-CM | POA: Diagnosis not present

## 2023-08-02 DIAGNOSIS — M19071 Primary osteoarthritis, right ankle and foot: Secondary | ICD-10-CM | POA: Diagnosis not present

## 2023-08-02 DIAGNOSIS — S92011D Displaced fracture of body of right calcaneus, subsequent encounter for fracture with routine healing: Secondary | ICD-10-CM | POA: Diagnosis not present

## 2023-08-02 DIAGNOSIS — M7671 Peroneal tendinitis, right leg: Secondary | ICD-10-CM | POA: Diagnosis not present

## 2023-08-02 DIAGNOSIS — Z981 Arthrodesis status: Secondary | ICD-10-CM | POA: Diagnosis not present

## 2023-08-02 DIAGNOSIS — W108XXD Fall (on) (from) other stairs and steps, subsequent encounter: Secondary | ICD-10-CM | POA: Diagnosis not present

## 2023-08-02 DIAGNOSIS — X58XXXD Exposure to other specified factors, subsequent encounter: Secondary | ICD-10-CM | POA: Diagnosis not present

## 2023-08-03 DIAGNOSIS — Z170 Estrogen receptor positive status [ER+]: Secondary | ICD-10-CM | POA: Diagnosis not present

## 2023-08-03 DIAGNOSIS — C50911 Malignant neoplasm of unspecified site of right female breast: Secondary | ICD-10-CM | POA: Diagnosis not present

## 2023-08-03 DIAGNOSIS — C50511 Malignant neoplasm of lower-outer quadrant of right female breast: Secondary | ICD-10-CM | POA: Diagnosis not present

## 2023-08-07 DIAGNOSIS — M7671 Peroneal tendinitis, right leg: Secondary | ICD-10-CM | POA: Diagnosis not present

## 2023-08-07 DIAGNOSIS — M216X1 Other acquired deformities of right foot: Secondary | ICD-10-CM | POA: Diagnosis not present

## 2023-08-08 DIAGNOSIS — C50911 Malignant neoplasm of unspecified site of right female breast: Secondary | ICD-10-CM | POA: Diagnosis not present

## 2023-08-09 DIAGNOSIS — C50911 Malignant neoplasm of unspecified site of right female breast: Secondary | ICD-10-CM | POA: Diagnosis not present

## 2023-08-10 DIAGNOSIS — Z7182 Exercise counseling: Secondary | ICD-10-CM | POA: Diagnosis not present

## 2023-08-10 DIAGNOSIS — C50911 Malignant neoplasm of unspecified site of right female breast: Secondary | ICD-10-CM | POA: Diagnosis not present

## 2023-08-10 DIAGNOSIS — Z1331 Encounter for screening for depression: Secondary | ICD-10-CM | POA: Diagnosis not present

## 2023-08-10 DIAGNOSIS — Z8673 Personal history of transient ischemic attack (TIA), and cerebral infarction without residual deficits: Secondary | ICD-10-CM | POA: Diagnosis not present

## 2023-08-10 DIAGNOSIS — Z7901 Long term (current) use of anticoagulants: Secondary | ICD-10-CM | POA: Diagnosis not present

## 2023-08-10 DIAGNOSIS — Z510 Encounter for antineoplastic radiation therapy: Secondary | ICD-10-CM | POA: Diagnosis not present

## 2023-08-10 DIAGNOSIS — I671 Cerebral aneurysm, nonruptured: Secondary | ICD-10-CM | POA: Diagnosis not present

## 2023-08-10 DIAGNOSIS — Z7982 Long term (current) use of aspirin: Secondary | ICD-10-CM | POA: Diagnosis not present

## 2023-08-10 DIAGNOSIS — H53462 Homonymous bilateral field defects, left side: Secondary | ICD-10-CM | POA: Diagnosis not present

## 2023-08-10 DIAGNOSIS — I4891 Unspecified atrial fibrillation: Secondary | ICD-10-CM | POA: Diagnosis not present

## 2023-08-14 DIAGNOSIS — C50911 Malignant neoplasm of unspecified site of right female breast: Secondary | ICD-10-CM | POA: Diagnosis not present

## 2023-08-15 DIAGNOSIS — C50911 Malignant neoplasm of unspecified site of right female breast: Secondary | ICD-10-CM | POA: Diagnosis not present

## 2023-08-16 DIAGNOSIS — C50911 Malignant neoplasm of unspecified site of right female breast: Secondary | ICD-10-CM | POA: Diagnosis not present

## 2023-08-17 DIAGNOSIS — C50911 Malignant neoplasm of unspecified site of right female breast: Secondary | ICD-10-CM | POA: Diagnosis not present

## 2023-08-18 DIAGNOSIS — Z79891 Long term (current) use of opiate analgesic: Secondary | ICD-10-CM | POA: Diagnosis not present

## 2023-08-18 DIAGNOSIS — Z131 Encounter for screening for diabetes mellitus: Secondary | ICD-10-CM | POA: Diagnosis not present

## 2023-08-18 DIAGNOSIS — C50911 Malignant neoplasm of unspecified site of right female breast: Secondary | ICD-10-CM | POA: Diagnosis not present

## 2023-08-18 DIAGNOSIS — I10 Essential (primary) hypertension: Secondary | ICD-10-CM | POA: Diagnosis not present

## 2023-08-18 DIAGNOSIS — E7849 Other hyperlipidemia: Secondary | ICD-10-CM | POA: Diagnosis not present

## 2023-08-18 DIAGNOSIS — N1831 Chronic kidney disease, stage 3a: Secondary | ICD-10-CM | POA: Diagnosis not present

## 2023-08-18 DIAGNOSIS — R7309 Other abnormal glucose: Secondary | ICD-10-CM | POA: Diagnosis not present

## 2023-08-21 DIAGNOSIS — C50911 Malignant neoplasm of unspecified site of right female breast: Secondary | ICD-10-CM | POA: Diagnosis not present

## 2023-08-22 DIAGNOSIS — C50911 Malignant neoplasm of unspecified site of right female breast: Secondary | ICD-10-CM | POA: Diagnosis not present

## 2023-08-24 DIAGNOSIS — I671 Cerebral aneurysm, nonruptured: Secondary | ICD-10-CM | POA: Diagnosis not present

## 2023-08-25 DIAGNOSIS — E785 Hyperlipidemia, unspecified: Secondary | ICD-10-CM | POA: Diagnosis not present

## 2023-08-25 DIAGNOSIS — Z95818 Presence of other cardiac implants and grafts: Secondary | ICD-10-CM | POA: Diagnosis not present

## 2023-08-25 DIAGNOSIS — Z1331 Encounter for screening for depression: Secondary | ICD-10-CM | POA: Diagnosis not present

## 2023-08-25 DIAGNOSIS — I129 Hypertensive chronic kidney disease with stage 1 through stage 4 chronic kidney disease, or unspecified chronic kidney disease: Secondary | ICD-10-CM | POA: Diagnosis not present

## 2023-08-25 DIAGNOSIS — N1831 Chronic kidney disease, stage 3a: Secondary | ICD-10-CM | POA: Diagnosis not present

## 2023-08-25 DIAGNOSIS — C50911 Malignant neoplasm of unspecified site of right female breast: Secondary | ICD-10-CM | POA: Diagnosis not present

## 2023-08-25 DIAGNOSIS — Z8673 Personal history of transient ischemic attack (TIA), and cerebral infarction without residual deficits: Secondary | ICD-10-CM | POA: Diagnosis not present

## 2023-08-25 DIAGNOSIS — F339 Major depressive disorder, recurrent, unspecified: Secondary | ICD-10-CM | POA: Diagnosis not present

## 2023-08-25 DIAGNOSIS — Z0000 Encounter for general adult medical examination without abnormal findings: Secondary | ICD-10-CM | POA: Diagnosis not present

## 2023-09-05 DIAGNOSIS — F419 Anxiety disorder, unspecified: Secondary | ICD-10-CM | POA: Diagnosis not present

## 2023-09-05 DIAGNOSIS — E785 Hyperlipidemia, unspecified: Secondary | ICD-10-CM | POA: Diagnosis not present

## 2023-09-05 DIAGNOSIS — I493 Ventricular premature depolarization: Secondary | ICD-10-CM | POA: Diagnosis not present

## 2023-09-05 DIAGNOSIS — I10 Essential (primary) hypertension: Secondary | ICD-10-CM | POA: Diagnosis not present

## 2023-09-05 DIAGNOSIS — Z8673 Personal history of transient ischemic attack (TIA), and cerebral infarction without residual deficits: Secondary | ICD-10-CM | POA: Diagnosis not present

## 2023-09-05 DIAGNOSIS — Z95818 Presence of other cardiac implants and grafts: Secondary | ICD-10-CM | POA: Diagnosis not present

## 2023-09-08 DIAGNOSIS — Z95818 Presence of other cardiac implants and grafts: Secondary | ICD-10-CM | POA: Diagnosis not present

## 2023-09-08 DIAGNOSIS — Z8673 Personal history of transient ischemic attack (TIA), and cerebral infarction without residual deficits: Secondary | ICD-10-CM | POA: Diagnosis not present

## 2023-10-10 DIAGNOSIS — I10 Essential (primary) hypertension: Secondary | ICD-10-CM | POA: Diagnosis not present

## 2023-10-10 DIAGNOSIS — I493 Ventricular premature depolarization: Secondary | ICD-10-CM | POA: Diagnosis not present

## 2023-10-10 DIAGNOSIS — Z780 Asymptomatic menopausal state: Secondary | ICD-10-CM | POA: Diagnosis not present

## 2023-10-10 DIAGNOSIS — T451X5D Adverse effect of antineoplastic and immunosuppressive drugs, subsequent encounter: Secondary | ICD-10-CM | POA: Diagnosis not present

## 2023-10-10 DIAGNOSIS — M2550 Pain in unspecified joint: Secondary | ICD-10-CM | POA: Diagnosis not present

## 2023-10-10 DIAGNOSIS — C50911 Malignant neoplasm of unspecified site of right female breast: Secondary | ICD-10-CM | POA: Diagnosis not present

## 2023-10-10 DIAGNOSIS — E785 Hyperlipidemia, unspecified: Secondary | ICD-10-CM | POA: Diagnosis not present

## 2023-10-10 DIAGNOSIS — Z9189 Other specified personal risk factors, not elsewhere classified: Secondary | ICD-10-CM | POA: Diagnosis not present

## 2023-10-10 DIAGNOSIS — T451X5A Adverse effect of antineoplastic and immunosuppressive drugs, initial encounter: Secondary | ICD-10-CM | POA: Diagnosis not present

## 2023-10-10 DIAGNOSIS — Z95818 Presence of other cardiac implants and grafts: Secondary | ICD-10-CM | POA: Diagnosis not present

## 2023-10-10 DIAGNOSIS — M2560 Stiffness of unspecified joint, not elsewhere classified: Secondary | ICD-10-CM | POA: Diagnosis not present

## 2023-10-10 DIAGNOSIS — F419 Anxiety disorder, unspecified: Secondary | ICD-10-CM | POA: Diagnosis not present

## 2023-10-10 DIAGNOSIS — Z8673 Personal history of transient ischemic attack (TIA), and cerebral infarction without residual deficits: Secondary | ICD-10-CM | POA: Diagnosis not present

## 2023-10-10 DIAGNOSIS — C50511 Malignant neoplasm of lower-outer quadrant of right female breast: Secondary | ICD-10-CM | POA: Diagnosis not present

## 2023-11-10 DIAGNOSIS — Z780 Asymptomatic menopausal state: Secondary | ICD-10-CM | POA: Diagnosis not present

## 2023-11-14 DIAGNOSIS — E785 Hyperlipidemia, unspecified: Secondary | ICD-10-CM | POA: Diagnosis not present

## 2023-11-14 DIAGNOSIS — Z95818 Presence of other cardiac implants and grafts: Secondary | ICD-10-CM | POA: Diagnosis not present

## 2023-11-14 DIAGNOSIS — F419 Anxiety disorder, unspecified: Secondary | ICD-10-CM | POA: Diagnosis not present

## 2023-11-14 DIAGNOSIS — I10 Essential (primary) hypertension: Secondary | ICD-10-CM | POA: Diagnosis not present

## 2023-11-14 DIAGNOSIS — I493 Ventricular premature depolarization: Secondary | ICD-10-CM | POA: Diagnosis not present

## 2023-11-14 DIAGNOSIS — Z8673 Personal history of transient ischemic attack (TIA), and cerebral infarction without residual deficits: Secondary | ICD-10-CM | POA: Diagnosis not present

## 2023-12-25 DIAGNOSIS — M25611 Stiffness of right shoulder, not elsewhere classified: Secondary | ICD-10-CM | POA: Diagnosis not present

## 2023-12-25 DIAGNOSIS — Z23 Encounter for immunization: Secondary | ICD-10-CM | POA: Diagnosis not present

## 2023-12-25 DIAGNOSIS — Z853 Personal history of malignant neoplasm of breast: Secondary | ICD-10-CM | POA: Diagnosis not present

## 2023-12-25 DIAGNOSIS — N6311 Unspecified lump in the right breast, upper outer quadrant: Secondary | ICD-10-CM | POA: Diagnosis not present

## 2024-01-01 DIAGNOSIS — E785 Hyperlipidemia, unspecified: Secondary | ICD-10-CM | POA: Diagnosis not present

## 2024-01-01 DIAGNOSIS — Z8673 Personal history of transient ischemic attack (TIA), and cerebral infarction without residual deficits: Secondary | ICD-10-CM | POA: Diagnosis not present

## 2024-01-01 DIAGNOSIS — I10 Essential (primary) hypertension: Secondary | ICD-10-CM | POA: Diagnosis not present

## 2024-01-01 DIAGNOSIS — I493 Ventricular premature depolarization: Secondary | ICD-10-CM | POA: Diagnosis not present

## 2024-01-01 DIAGNOSIS — Z95818 Presence of other cardiac implants and grafts: Secondary | ICD-10-CM | POA: Diagnosis not present

## 2024-01-01 DIAGNOSIS — F419 Anxiety disorder, unspecified: Secondary | ICD-10-CM | POA: Diagnosis not present

## 2024-01-10 DIAGNOSIS — Z9889 Other specified postprocedural states: Secondary | ICD-10-CM | POA: Diagnosis not present

## 2024-01-10 DIAGNOSIS — N6311 Unspecified lump in the right breast, upper outer quadrant: Secondary | ICD-10-CM | POA: Diagnosis not present
